# Patient Record
Sex: Female | Born: 1975 | Race: Black or African American | Hispanic: No | Marital: Married | Smoking: Never smoker
Health system: Southern US, Community
[De-identification: ages and names within clinical notes are randomized; demographics above are authoritative.]

## PROBLEM LIST (undated history)

## (undated) DIAGNOSIS — F32A Depression, unspecified: Secondary | ICD-10-CM

## (undated) DIAGNOSIS — D649 Anemia, unspecified: Secondary | ICD-10-CM

## (undated) DIAGNOSIS — G35 Multiple sclerosis: Secondary | ICD-10-CM

## (undated) DIAGNOSIS — M329 Systemic lupus erythematosus, unspecified: Secondary | ICD-10-CM

## (undated) DIAGNOSIS — IMO0002 Reserved for concepts with insufficient information to code with codable children: Secondary | ICD-10-CM

## (undated) DIAGNOSIS — F419 Anxiety disorder, unspecified: Secondary | ICD-10-CM

## (undated) DIAGNOSIS — T7840XA Allergy, unspecified, initial encounter: Secondary | ICD-10-CM

## (undated) DIAGNOSIS — K219 Gastro-esophageal reflux disease without esophagitis: Secondary | ICD-10-CM

## (undated) DIAGNOSIS — H269 Unspecified cataract: Secondary | ICD-10-CM

## (undated) DIAGNOSIS — I639 Cerebral infarction, unspecified: Secondary | ICD-10-CM

## (undated) HISTORY — DX: Depression, unspecified: F32.A

## (undated) HISTORY — PX: EYE SURGERY: SHX253

## (undated) HISTORY — DX: Anxiety disorder, unspecified: F41.9

## (undated) HISTORY — DX: Anemia, unspecified: D64.9

## (undated) HISTORY — PX: GASTRIC BYPASS: SHX52

## (undated) HISTORY — DX: Unspecified cataract: H26.9

## (undated) HISTORY — DX: Allergy, unspecified, initial encounter: T78.40XA

## (undated) HISTORY — DX: Gastro-esophageal reflux disease without esophagitis: K21.9

## (undated) HISTORY — DX: Cerebral infarction, unspecified: I63.9

## (undated) HISTORY — PX: TUBAL LIGATION: SHX77

## (undated) HISTORY — PX: ENDOMETRIAL ABLATION: SHX621

## (undated) HISTORY — PX: CATARACT EXTRACTION: SUR2

---

## 2018-10-19 LAB — HM HEPATITIS C SCREENING LAB: HM Hepatitis Screen: NEGATIVE

## 2018-10-19 LAB — HM HIV SCREENING LAB: HM HIV Screening: NEGATIVE

## 2020-07-11 LAB — LIPID PANEL
Cholesterol: 131 (ref 0–200)
HDL: 58 (ref 35–70)
LDL Cholesterol: 64
Triglycerides: 56 (ref 40–160)

## 2020-07-11 LAB — TSH: TSH: 3.18 (ref 0.41–5.90)

## 2020-09-12 ENCOUNTER — Emergency Department (HOSPITAL_BASED_OUTPATIENT_CLINIC_OR_DEPARTMENT_OTHER)
Admission: EM | Admit: 2020-09-12 | Discharge: 2020-09-12 | Disposition: A | Discharge status: Home / Self Care | Payer: 59 | Attending: Emergency Medicine | Admitting: Emergency Medicine

## 2020-09-12 ENCOUNTER — Encounter (HOSPITAL_BASED_OUTPATIENT_CLINIC_OR_DEPARTMENT_OTHER): Payer: Self-pay

## 2020-09-12 ENCOUNTER — Emergency Department (HOSPITAL_BASED_OUTPATIENT_CLINIC_OR_DEPARTMENT_OTHER): Payer: 59

## 2020-09-12 ENCOUNTER — Other Ambulatory Visit: Payer: Self-pay

## 2020-09-12 DIAGNOSIS — N898 Other specified noninflammatory disorders of vagina: Secondary | ICD-10-CM | POA: Diagnosis present

## 2020-09-12 DIAGNOSIS — B9689 Other specified bacterial agents as the cause of diseases classified elsewhere: Secondary | ICD-10-CM

## 2020-09-12 DIAGNOSIS — B373 Candidiasis of vulva and vagina: Secondary | ICD-10-CM | POA: Insufficient documentation

## 2020-09-12 DIAGNOSIS — R110 Nausea: Secondary | ICD-10-CM | POA: Insufficient documentation

## 2020-09-12 DIAGNOSIS — R1030 Lower abdominal pain, unspecified: Secondary | ICD-10-CM | POA: Diagnosis not present

## 2020-09-12 DIAGNOSIS — N760 Acute vaginitis: Secondary | ICD-10-CM | POA: Insufficient documentation

## 2020-09-12 DIAGNOSIS — B3731 Acute candidiasis of vulva and vagina: Secondary | ICD-10-CM

## 2020-09-12 HISTORY — DX: Multiple sclerosis: G35

## 2020-09-12 HISTORY — DX: Systemic lupus erythematosus, unspecified: M32.9

## 2020-09-12 HISTORY — DX: Reserved for concepts with insufficient information to code with codable children: IMO0002

## 2020-09-12 LAB — CBC WITH DIFFERENTIAL/PLATELET
Abs Immature Granulocytes: 0.03 10*3/uL (ref 0.00–0.07)
Basophils Absolute: 0 10*3/uL (ref 0.0–0.1)
Basophils Relative: 0 %
Eosinophils Absolute: 0 10*3/uL (ref 0.0–0.5)
Eosinophils Relative: 0 %
HCT: 32.9 % — ABNORMAL LOW (ref 36.0–46.0)
Hemoglobin: 11.2 g/dL — ABNORMAL LOW (ref 12.0–15.0)
Immature Granulocytes: 0 %
Lymphocytes Relative: 4 %
Lymphs Abs: 0.3 10*3/uL — ABNORMAL LOW (ref 0.7–4.0)
MCH: 31.2 pg (ref 26.0–34.0)
MCHC: 34 g/dL (ref 30.0–36.0)
MCV: 91.6 fL (ref 80.0–100.0)
Monocytes Absolute: 0.2 10*3/uL (ref 0.1–1.0)
Monocytes Relative: 2 %
Neutro Abs: 8.1 10*3/uL — ABNORMAL HIGH (ref 1.7–7.7)
Neutrophils Relative %: 94 %
Platelets: 277 10*3/uL (ref 150–400)
RBC: 3.59 MIL/uL — ABNORMAL LOW (ref 3.87–5.11)
RDW: 14.2 % (ref 11.5–15.5)
WBC: 8.6 10*3/uL (ref 4.0–10.5)
nRBC: 0 % (ref 0.0–0.2)

## 2020-09-12 LAB — URINALYSIS, ROUTINE W REFLEX MICROSCOPIC
Bilirubin Urine: NEGATIVE
Glucose, UA: NEGATIVE mg/dL
Hgb urine dipstick: NEGATIVE
Ketones, ur: 80 mg/dL — AB
Leukocytes,Ua: NEGATIVE
Nitrite: NEGATIVE
Protein, ur: NEGATIVE mg/dL
Specific Gravity, Urine: 1.025 (ref 1.005–1.030)
pH: 6.5 (ref 5.0–8.0)

## 2020-09-12 LAB — MAGNESIUM: Magnesium: 1.5 mg/dL — ABNORMAL LOW (ref 1.7–2.4)

## 2020-09-12 LAB — WET PREP, GENITAL
Sperm: NONE SEEN
Trich, Wet Prep: NONE SEEN

## 2020-09-12 LAB — LIPASE, BLOOD: Lipase: 19 U/L (ref 11–51)

## 2020-09-12 LAB — COMPREHENSIVE METABOLIC PANEL
ALT: 12 U/L (ref 0–44)
AST: 19 U/L (ref 15–41)
Albumin: 3.2 g/dL — ABNORMAL LOW (ref 3.5–5.0)
Alkaline Phosphatase: 62 U/L (ref 38–126)
Anion gap: 9 (ref 5–15)
BUN: 7 mg/dL (ref 6–20)
CO2: 20 mmol/L — ABNORMAL LOW (ref 22–32)
Calcium: 8.1 mg/dL — ABNORMAL LOW (ref 8.9–10.3)
Chloride: 103 mmol/L (ref 98–111)
Creatinine, Ser: 0.5 mg/dL (ref 0.44–1.00)
GFR, Estimated: >60 mL/min (ref >60)
Glucose, Bld: 95 mg/dL (ref 70–99)
Potassium: 3.6 mmol/L (ref 3.5–5.1)
Sodium: 132 mmol/L — ABNORMAL LOW (ref 135–145)
Total Bilirubin: 0.3 mg/dL (ref 0.3–1.2)
Total Protein: 5.8 g/dL — ABNORMAL LOW (ref 6.5–8.1)

## 2020-09-12 LAB — PREGNANCY, URINE: Preg Test, Ur: NEGATIVE

## 2020-09-12 MED ORDER — FLUCONAZOLE 150 MG PO TABS
150.0000 mg | ORAL_TABLET | Freq: Once | ORAL | Status: AC
  Administered 2020-09-12: 150 mg via ORAL
  Filled 2020-09-12: qty 1

## 2020-09-12 MED ORDER — OXYCODONE HCL 5 MG PO TABS
10.0000 mg | ORAL_TABLET | Freq: Once | ORAL | Status: AC
  Administered 2020-09-12: 10 mg via ORAL
  Filled 2020-09-12: qty 2

## 2020-09-12 MED ORDER — METRONIDAZOLE 500 MG PO TABS: 500.0000 mg | ORAL_TABLET | Freq: Two times a day (BID) | ORAL | 0 refills | Status: AC

## 2020-09-12 MED ORDER — MAGNESIUM SULFATE 2 GM/50ML IV SOLN
2.0000 g | Freq: Once | INTRAVENOUS | Status: AC
  Administered 2020-09-12: 2 g via INTRAVENOUS
  Filled 2020-09-12: qty 50

## 2020-09-12 MED ORDER — IOHEXOL 350 MG/ML SOLN
100.0000 mL | Freq: Once | INTRAVENOUS | Status: AC | PRN
  Administered 2020-09-12: 85 mL via INTRAVENOUS

## 2020-09-12 MED ORDER — LACTATED RINGERS IV BOLUS
1000.0000 mL | Freq: Once | INTRAVENOUS | Status: AC
  Administered 2020-09-12: 1000 mL via INTRAVENOUS

## 2020-09-12 MED ORDER — METOCLOPRAMIDE HCL 5 MG/ML IJ SOLN
10.0000 mg | Freq: Once | INTRAMUSCULAR | Status: AC
  Administered 2020-09-12: 10 mg via INTRAVENOUS
  Filled 2020-09-12: qty 2

## 2020-09-12 NOTE — ED Triage Notes (Signed)
Pt c/o lower abd pain, nausea, increase urination, vaginal dx-sx started 4 days ago-NAD-to triage in w/c

## 2020-09-12 NOTE — ED Provider Notes (Signed)
[facility] EMERGENCY DEPARTMENT Provider Note   CSN: [id number] Arrival date & time: 09/12/20  1249     History Chief Complaint  Patient presents with   Abdominal Pain    Sabrina Owen is a 45 y.o. female.   Abdominal Pain Associated symptoms: nausea and vaginal discharge   Associated symptoms: no chest pain, no chills, no constipation, no cough, no diarrhea, no dysuria, no fatigue, no fever, no hematuria, no shortness of breath, no sore throat, no vaginal bleeding and no vomiting   Patient presents for lower abdominal pain. She has a history of MS.  She is followed by neurology at [facility].  She started getting Rituxan IV infusions every 5 months.  Her initial infusion was last month.  She states that since this infusion, she has had flareup of genital herpes and has generally felt unwell.  She has had ongoing decreased appetite and intermittent nausea.  She states that she has chronic bladder symptoms and has been having urinary urgency and frequency.  She denies any dysuria.  Over the past 4 days, she has developed a lower abdominal pain.  Pain is described as crampy.  It is reminiscent of her menstrual cycle pains, however, patient no longer has menstrual periods due to a previous uterine ablation.  She has been told that she has uterine fibroids.  She treats her pain with Tylenol and she did take a dose of oxycodone earlier today which did help her symptoms.  She states that she avoids NSAIDs due to history of gastric bypass surgery.  Patient has been able to eat, despite decreased appetite.  She has been able to take in fluids.  She has not had vomiting.  She has not had fevers or chills.  She does describe a white vaginal discharge.    Past Medical History:  Diagnosis Date   Lupus (HCC)    MS (multiple sclerosis) (HCC)     There are no problems to display for this patient.   Past Surgical History:  Procedure Laterality Date   CATARACT EXTRACTION     GASTRIC  BYPASS     TUBAL LIGATION       OB History   No obstetric history on file.     No family history on file.  Social History   Tobacco Use   Smoking status: Never   Smokeless tobacco: Never  Substance Use Topics   Alcohol use: Yes    Comment: occ   Drug use: Never    Home Medications Prior to Admission medications   Medication Sig Start Date End Date Taking? Authorizing Provider  FLUoxetine (PROZAC) 20 MG capsule Take 3 capsules by mouth daily. 01/13/13  Yes [provider]  metroNIDAZOLE (FLAGYL) 500 MG tablet Take 1 tablet (500 mg total) by mouth 2 (two) times daily for 7 days. 09/12/20 09/19/20 Yes Gloris Manchester, MD  valACYclovir (VALTREX) 500 MG tablet Take 1 tablet by mouth daily. 08/29/12  Yes [provider]  Multiple Vitamin (MULTI-VITAMIN) tablet Take 1 tablet by mouth daily.    [provider]    Allergies    Topamax [topiramate]  Review of Systems   Review of Systems  Constitutional:  Positive for appetite change. Negative for activity change, chills, diaphoresis, fatigue and fever.  HENT:  Negative for ear pain and sore throat.   Eyes:  Negative for pain and visual disturbance.  Respiratory:  Negative for cough, chest tightness and shortness of breath.   Cardiovascular:  Negative for chest  pain and palpitations.  Gastrointestinal:  Positive for abdominal pain and nausea. Negative for abdominal distention, blood in stool, constipation, diarrhea and vomiting.  Genitourinary:  Positive for frequency, pelvic pain, urgency and vaginal discharge. Negative for dysuria, flank pain, hematuria and vaginal bleeding.  Musculoskeletal:  Negative for arthralgias, back pain, joint swelling and myalgias.  Skin:  Negative for color change and rash.  Neurological:  Negative for dizziness, seizures, syncope, weakness, light-headedness, numbness and headaches.  All other systems reviewed and are negative.  Physical Exam Updated Vital Signs BP 119/84 (BP  Location: Right Arm)   Pulse 81   Temp 97.9 F (36.6 C) (Oral)   Resp 18   Ht 5\' 8"  (1.727 m)   Wt 82.1 kg   SpO2 99%   BMI 27.52 kg/m   Physical Exam Vitals and nursing note reviewed.  Constitutional:      General: She is not in acute distress.    Appearance: She is well-developed. She is not ill-appearing, toxic-appearing or diaphoretic.  HENT:     Head: Normocephalic and atraumatic.     Mouth/Throat:     Mouth: Mucous membranes are moist.     Pharynx: Oropharynx is clear.  Eyes:     Conjunctiva/sclera: Conjunctivae normal.  Cardiovascular:     Rate and Rhythm: Normal rate and regular rhythm.     Heart sounds: No murmur heard. Pulmonary:     Effort: Pulmonary effort is normal. No respiratory distress.     Breath sounds: Normal breath sounds.  Abdominal:     Palpations: Abdomen is soft.     Tenderness: There is abdominal tenderness in the suprapubic area. There is no right CVA tenderness, left CVA tenderness, guarding or rebound.  Musculoskeletal:     Cervical back: Neck supple.  Skin:    General: Skin is warm and dry.  Neurological:     General: No focal deficit present.     Mental Status: She is alert and oriented to person, place, and time.     Cranial Nerves: No cranial nerve deficit.     Motor: No weakness.  Psychiatric:        Mood and Affect: Mood normal. Affect is flat.        Speech: Speech normal.        Behavior: Behavior normal. Behavior is cooperative.        Thought Content: Thought content normal.    ED Results / Procedures / Treatments   Labs (all labs ordered are listed, but only abnormal results are displayed) Labs Reviewed  WET PREP, GENITAL - Abnormal; Notable for the following components:      Result Value   Yeast Wet Prep HPF POC MODERATE (*)    Clue Cells Wet Prep HPF POC PRESENT (*)    WBC, Wet Prep HPF POC FEW (*)    All other components within normal limits  CBC WITH DIFFERENTIAL/PLATELET - Abnormal; Notable for the following  components:   RBC 3.59 (*)    Hemoglobin 11.2 (*)    HCT 32.9 (*)    Neutro Abs 8.1 (*)    Lymphs Abs 0.3 (*)    All other components within normal limits  COMPREHENSIVE METABOLIC PANEL - Abnormal; Notable for the following components:   Sodium 132 (*)    CO2 20 (*)    Calcium 8.1 (*)    Total Protein 5.8 (*)    Albumin 3.2 (*)    All other components within normal limits  MAGNESIUM - Abnormal; Notable  for the following components:   Magnesium 1.5 (*)    All other components within normal limits  URINALYSIS, ROUTINE W REFLEX MICROSCOPIC - Abnormal; Notable for the following components:   Ketones, ur 80 (*)    All other components within normal limits  LIPASE, BLOOD  PREGNANCY, URINE  GC/CHLAMYDIA PROBE AMP () NOT AT [facility]    EKG None  Radiology CT ABDOMEN PELVIS W CONTRAST  Result Date: 09/12/2020 CLINICAL DATA:  Lower abdominal pain, nausea, increased urination EXAM: CT ABDOMEN AND PELVIS WITH CONTRAST TECHNIQUE: Multidetector CT imaging of the abdomen and pelvis was performed using the standard protocol following bolus administration of intravenous contrast. CONTRAST:  60mL OMNIPAQUE IOHEXOL 350 MG/ML SOLN COMPARISON:  CT abdomen/pelvis 06/03/2019 FINDINGS: Lower chest: The lung bases are clear. The imaged heart is unremarkable. Hepatobiliary: Hypodensity along the falciform ligament is unchanged, likely reflecting fatty infiltration. A subcentimeter hypodense lesion in the left hepatic lobe is too small to characterize but likely reflects a cyst. There are no suspicious lesions. The gallbladder is unremarkable. There is no biliary ductal dilatation. Pancreas: Unremarkable. Spleen: Unremarkable. Adrenals/Urinary Tract: The adrenals are unremarkable. There is an 11 mm nonobstructing left lower pole renal stone, increased in size since the prior study. No other focal lesions are stones are identified. There is no hydronephrosis or hydroureter. The bladder is decompressed but  grossly unremarkable. Stomach/Bowel: The patient is status post Roux-en-Y gastric bypass. There is no evidence of complication at the anastomotic sites. There is no evidence of bowel obstruction. There is no abnormal bowel wall thickening or inflammatory change. Vascular/Lymphatic: The abdominal aorta is nonaneurysmal. The major branch vessels are patent. The main portal and splenic veins are patent. 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 Reproductive: There is a 2.7 cm intramural fibroid in the posterior aspect of the uterus, unchanged. There is a 3.0 cm right adnexal cyst. There is no left adnexal mass. Other: There is no ascites or free air. Musculoskeletal: There is no acute osseous abnormality or aggressive osseous lesion. IMPRESSION: 1. No acute findings in the abdomen or pelvis. 2. 1.1 cm nonobstructing left lower pole renal stone has increased in size since the prior study from 2021. 3. Fibroid uterus. Electronically Signed   By: Lesia Hausen M.D.   On: 09/12/2020 15:18    Procedures Procedures   Medications Ordered in ED Medications  lactated ringers bolus 1,000 mL (0 mLs Intravenous Stopped 09/12/20 1545)  metoCLOPramide (REGLAN) injection 10 mg (10 mg Intravenous Given 09/12/20 1400)  iohexol (OMNIPAQUE) 350 MG/ML injection 100 mL (85 mLs Intravenous Contrast Given 09/12/20 1437)  magnesium sulfate IVPB 2 g 50 mL (0 g Intravenous Stopped 09/12/20 1649)  oxyCODONE (Oxy IR/ROXICODONE) immediate release tablet 10 mg (10 mg Oral Given 09/12/20 1602)  fluconazole (DIFLUCAN) tablet 150 mg (150 mg Oral Given 09/12/20 1649)    ED Course  I have reviewed the triage vital signs and the nursing notes.  Pertinent labs & imaging results that were available during my care of the patient were reviewed by me and considered in my medical decision making (see chart for details).    MDM Rules/Calculators/A&P                         Patient is a 45 year old female with history of gastric bypass  surgery, MS, and uterine ablation, presenting for crampy lower abdominal pain over the past 4 days.  She also describes a scant white vaginal  discharge.  Vital signs normal upon arrival.  Patient is overall well-appearing.  Exam is notable for lower abdominal tenderness that is not greater on one side or the other.  Labs, including urine studies were ordered.  Given her history of bypass surgery, CT scan of abdomen pelvis was ordered.  Patient was given IV fluids.  Reglan was ordered for symptomatic relief.  Urinalysis showed no evidence of acute infection.  Patient did have ketonuria, consistent with decreased p.o. intake.  Labs notable for hypomagnesemia, which was replaced in the ED.  Patient does not have a leukocytosis.  CT scan showed no acute findings.  Patient does have a fibroid uterus.  On reassessment, patient endorsed continued lower abdominal pain.  Roxicodone was given for analgesia.  Pelvic exam was performed with chaperone present.  Findings notable for thick white discharge.  While in the ED, patient had resolution of her abdominal pain and was able to eat and drink.  Wet prep showed vaginal candidiasis as well as bacterial vaginosis.  Patient was ordered single dose of Diflucan and provided prescription for Flagyl.  She was discharged in good condition.  Final Clinical Impression(s) / ED Diagnoses Final diagnoses:  Vaginal candidiasis  BV (bacterial vaginosis)    Rx / DC Orders ED Discharge Orders          Ordered    metroNIDAZOLE (FLAGYL) 500 MG tablet  2 times daily        09/12/20 1641             Gloris Manchester, MD 09/13/20 1720

## 2020-09-12 NOTE — ED Notes (Signed)
IV access unsuccessful x 2 attempts. 2nd RN to attempt.

## 2020-09-12 NOTE — ED Notes (Signed)
Delice Bison, RN placed successful IV, no blood obtained. Fluids started and med given, will attempt bloodwork again

## 2020-09-12 NOTE — ED Notes (Signed)
Patient transported to CT 

## 2020-09-12 NOTE — ED Notes (Signed)
ED Provider at bedside. Dixon MD. 

## 2020-09-13 LAB — GC/CHLAMYDIA PROBE AMP (~~LOC~~) NOT AT ARMC
Chlamydia: NEGATIVE
Comment: NEGATIVE
Comment: NORMAL
Neisseria Gonorrhea: NEGATIVE

## 2020-11-21 ENCOUNTER — Ambulatory Visit: Payer: 59 | Admitting: Internal Medicine

## 2020-11-21 ENCOUNTER — Encounter: Payer: Self-pay | Admitting: Internal Medicine

## 2020-11-21 ENCOUNTER — Other Ambulatory Visit: Payer: Self-pay

## 2020-11-21 VITALS — BP 128/84 | HR 64 | Temp 98.1°F | Resp 16 | Ht 68.0 in | Wt 180.0 lb

## 2020-11-21 DIAGNOSIS — Z9884 Bariatric surgery status: Secondary | ICD-10-CM | POA: Diagnosis not present

## 2020-11-21 DIAGNOSIS — G35 Multiple sclerosis: Secondary | ICD-10-CM | POA: Diagnosis not present

## 2020-11-21 DIAGNOSIS — D539 Nutritional anemia, unspecified: Secondary | ICD-10-CM | POA: Diagnosis not present

## 2020-11-21 DIAGNOSIS — D538 Other specified nutritional anemias: Secondary | ICD-10-CM

## 2020-11-21 DIAGNOSIS — E519 Thiamine deficiency, unspecified: Secondary | ICD-10-CM

## 2020-11-21 LAB — CBC WITH DIFFERENTIAL/PLATELET
Basophils Absolute: 0 10*3/uL (ref 0.0–0.1)
Basophils Relative: 0.6 % (ref 0.0–3.0)
Eosinophils Absolute: 0.1 10*3/uL (ref 0.0–0.7)
Eosinophils Relative: 1.2 % (ref 0.0–5.0)
HCT: 36.8 % (ref 36.0–46.0)
Hemoglobin: 12 g/dL (ref 12.0–15.0)
Lymphocytes Relative: 20.4 % (ref 12.0–46.0)
Lymphs Abs: 1.3 10*3/uL (ref 0.7–4.0)
MCHC: 32.7 g/dL (ref 30.0–36.0)
MCV: 91.3 fl (ref 78.0–100.0)
Monocytes Absolute: 0.6 10*3/uL (ref 0.1–1.0)
Monocytes Relative: 9.1 % (ref 3.0–12.0)
Neutro Abs: 4.3 10*3/uL (ref 1.4–7.7)
Neutrophils Relative %: 68.7 % (ref 43.0–77.0)
Platelets: 312 10*3/uL (ref 150.0–400.0)
RBC: 4.03 Mil/uL (ref 3.87–5.11)
RDW: 15 % (ref 11.5–15.5)
WBC: 6.3 10*3/uL (ref 4.0–10.5)

## 2020-11-21 LAB — BASIC METABOLIC PANEL
BUN: 10 mg/dL (ref 6–23)
CO2: 30 mEq/L (ref 19–32)
Calcium: 8.5 mg/dL (ref 8.4–10.5)
Chloride: 103 mEq/L (ref 96–112)
Creatinine, Ser: 0.71 mg/dL (ref 0.40–1.20)
GFR: 103.59 mL/min (ref >60.00)
Glucose, Bld: 82 mg/dL (ref 70–99)
Potassium: 3.7 mEq/L (ref 3.5–5.1)
Sodium: 139 mEq/L (ref 135–145)

## 2020-11-21 LAB — IBC + FERRITIN
Ferritin: 13.2 ng/mL (ref 10.0–291.0)
Iron: 47 ug/dL (ref 42–145)
Saturation Ratios: 13.1 % — ABNORMAL LOW (ref 20.0–50.0)
TIBC: 359.8 ug/dL (ref 250.0–450.0)
Transferrin: 257 mg/dL (ref 212.0–360.0)

## 2020-11-21 LAB — VITAMIN B12: Vitamin B-12: 232 pg/mL (ref 211–911)

## 2020-11-21 LAB — FOLATE: Folate: 7.2 ng/mL (ref >5.9)

## 2020-11-21 NOTE — Patient Instructions (Signed)
Anemia Anemia is a condition in which there is not enough red blood cells or hemoglobin in the blood. Hemoglobin is a substance in red blood cells that carries oxygen. When you do not have enough red blood cells or hemoglobin (are anemic), your body cannot get enough oxygen and your organs may not work properly. As a result, you may feel very tired or have other problems. What are the causes? Common causes of anemia include: Excessive bleeding. Anemia can be caused by excessive bleeding inside or outside the body, including bleeding from the intestines or from heavy menstrual periods in females. Poor nutrition. Long-lasting (chronic) kidney, thyroid, and liver disease. Bone marrow disorders, spleen problems, and blood disorders. Cancer and treatments for cancer. HIV (human immunodeficiency virus) and AIDS (acquired immunodeficiency syndrome). Infections, medicines, and autoimmune disorders that destroy red blood cells. What are the signs or symptoms? Symptoms of this condition include: Minor weakness. Dizziness. Headache, or difficulties concentrating and sleeping. Heartbeats that feel irregular or faster than normal (palpitations). Shortness of breath, especially with exercise. Pale skin, lips, and nails, or cold hands and feet. Indigestion and nausea. Symptoms may occur suddenly or develop slowly. If your anemia is mild, you may not have symptoms. How is this diagnosed? This condition is diagnosed based on blood tests, your medical history, and a physical exam. In some cases, a test may be needed in which cells are removed from the soft tissue inside of a bone and looked at under a microscope (bone marrow biopsy). Your health care provider may also check your stool (feces) for blood and may do additional testing to look for the cause of your bleeding. Other tests may include: Imaging tests, such as a CT scan or MRI. A procedure to see inside your esophagus and stomach (endoscopy). A  procedure to see inside your colon and rectum (colonoscopy). How is this treated? Treatment for this condition depends on the cause. If you continue to lose a lot of blood, you may need to be treated at a hospital. Treatment may include: Taking supplements of iron, vitamin B12, or folic acid. Taking a hormone medicine (erythropoietin) that can help to stimulate red blood cell growth. Having a blood transfusion. This may be needed if you lose a lot of blood. Making changes to your diet. Having surgery to remove your spleen. Follow these instructions at home: Take over-the-counter and prescription medicines only as told by your health care provider. Take supplements only as told by your health care provider. Follow any diet instructions that you were given by your health care provider. Keep all follow-up visits as told by your health care provider. This is important. Contact a health care provider if: You develop new bleeding anywhere in the body. Get help right away if: You are very weak. You are short of breath. You have pain in your abdomen or chest. You are dizzy or feel faint. You have trouble concentrating. You have bloody stools, black stools, or tarry stools. You vomit repeatedly or you vomit up blood. These symptoms may represent a serious problem that is an emergency. Do not wait to see if the symptoms will go away. Get medical help right away. Call your local emergency services (911 in the [place] Do not drive yourself to the hospital. Summary Anemia is a condition in which you do not have enough red blood cells or enough of a substance in your red blood cells that carries oxygen (hemoglobin). Symptoms may occur suddenly or develop slowly. If your anemia is   mild, you may not have symptoms. This condition is diagnosed with blood tests, a medical history, and a physical exam. Other tests may be needed. Treatment for this condition depends on the cause of the anemia. This  information is not intended to replace advice given to you by your health care provider. Make sure you discuss any questions you have with your health care provider. Document Revised: 11/30/2018 Document Reviewed: 11/30/2018 Elsevier Patient Education  2022 [employer]vier Inc.  

## 2020-11-21 NOTE — Progress Notes (Signed)
Subjective:  Patient ID: Sabrina Owen, female    DOB: [birth year]  Age: 45 y.o. MRN: [id number]  CC: Anemia  This visit occurred during the SARS-CoV-2 public health emergency.  Safety protocols were in place, including screening questions prior to the visit, additional usage of staff PPE, and extensive cleaning of exam room while observing appropriate contact time as indicated for disinfecting solutions.    HPI Tamula Morrical presents for f/up and to establish.  She is s/p gastric sleeve and bypass. Recent labs revealed anemia. She is taking an MVI. She does not have cycles s/p ablation.  History Kiosha has a past medical history of Lupus (HCC) and MS (multiple sclerosis) (HCC).   She has a past surgical history that includes Gastric bypass; Cataract extraction; and Tubal ligation.   Her family history includes Depression in her mother; Hypertension in her mother.She reports that she has never smoked. She has never used smokeless tobacco. She reports current alcohol use of about 2.0 standard drinks per week. She reports that she does not use drugs.  Outpatient Medications Prior to Visit  Medication Sig Dispense Refill   FLUoxetine (PROZAC) 20 MG capsule Take 3 capsules by mouth daily.     Multiple Vitamin (MULTI-VITAMIN) tablet Take 1 tablet by mouth daily.     valACYclovir (VALTREX) 500 MG tablet Take 1 tablet by mouth daily.     No facility-administered medications prior to visit.    ROS Review of Systems  Objective:  BP 128/84 (BP Location: Left Arm, Patient Position: Sitting, Cuff Size: Large)   Pulse 64   Temp 98.1 F (36.7 C) (Oral)   Resp 16   Ht 5\' 8"  (1.727 m)   Wt 180 lb (81.6 kg)   SpO2 97%   BMI 27.37 kg/m   Physical Exam  Lab Results  Component Value Date   WBC 6.3 11/21/2020   HGB 12.0 11/21/2020   HCT 36.8 11/21/2020   PLT 312.0 11/21/2020   GLUCOSE 82 11/21/2020   CHOL 131 07/11/2020   TRIG 56 07/11/2020   HDL 58 07/11/2020   LDLCALC 64 07/11/2020    ALT 12 09/12/2020   AST 19 09/12/2020   NA 139 11/21/2020   K 3.7 11/21/2020   CL 103 11/21/2020   CREATININE 0.71 11/21/2020   BUN 10 11/21/2020   CO2 30 11/21/2020   TSH 3.18 07/11/2020     Assessment & Plan:   Shelsie was seen today for anemia.  Diagnoses and all orders for this visit:  Deficiency anemia- Her H/H have improved but her B1 and zinc are low. -     Vitamin B12; Future -     IBC + Ferritin; Future -     CBC with Differential/Platelet; Future -     Vitamin B1; Future -     Zinc; Future -     Folate; Future -     Folate -     Zinc -     Vitamin B1 -     CBC with Differential/Platelet -     IBC + Ferritin -     Vitamin B12  Gastric bypass status for obesity -     Vitamin B12; Future -     IBC + Ferritin; Future -     CBC with Differential/Platelet; Future -     Vitamin B1; Future -     Zinc; Future -     Folate; Future -     Folate -     Zinc -  Vitamin B1 -     CBC with Differential/Platelet -     IBC + Ferritin -     Vitamin B12  MS (multiple sclerosis) (HCC) -     Basic metabolic panel; Future -     Basic metabolic panel  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  Anemia due to acquired thiamine deficiency -     thiamine (VITAMIN B-1) 50 MG tablet; Take 1 tablet (50 mg total) by mouth daily.  I am having Alira Fretwell start on thiamine and zinc gluconate. I am also having her maintain her FLUoxetine, Multi-Vitamin, and valACYclovir.  Meds ordered this encounter  Medications   thiamine (VITAMIN B-1) 50 MG tablet    Sig: Take 1 tablet (50 mg total) by mouth daily.    Dispense:  90 tablet    Refill:  1   zinc gluconate 50 MG tablet    Sig: Take 1 tablet (50 mg total) by mouth daily.    Dispense:  90 tablet    Refill:  1      Follow-up: Return in about 3 months (around 02/21/2021).  Sanda Linger, MD

## 2020-11-23 ENCOUNTER — Encounter: Payer: Self-pay | Admitting: Internal Medicine

## 2020-11-24 ENCOUNTER — Encounter: Payer: Self-pay | Admitting: Internal Medicine

## 2020-11-24 DIAGNOSIS — E519 Thiamine deficiency, unspecified: Secondary | ICD-10-CM | POA: Insufficient documentation

## 2020-11-24 DIAGNOSIS — D538 Other specified nutritional anemias: Secondary | ICD-10-CM | POA: Insufficient documentation

## 2020-11-24 LAB — VITAMIN B1: Vitamin B1 (Thiamine): 8 nmol/L (ref 8–30)

## 2020-11-24 LAB — ZINC: Zinc: 42 ug/dL — ABNORMAL LOW (ref 60–130)

## 2020-11-24 MED ORDER — ZINC GLUCONATE 50 MG PO TABS: 50.0000 mg | ORAL_TABLET | Freq: Every day | ORAL | 1 refills | Status: DC

## 2020-11-24 MED ORDER — VITAMIN B-1 50 MG PO TABS: 50.0000 mg | ORAL_TABLET | Freq: Every day | ORAL | 1 refills | Status: DC

## 2020-12-19 ENCOUNTER — Encounter: Payer: Self-pay | Admitting: Internal Medicine

## 2021-01-08 ENCOUNTER — Telehealth: Payer: 59 | Admitting: Physician Assistant

## 2021-01-08 DIAGNOSIS — J069 Acute upper respiratory infection, unspecified: Secondary | ICD-10-CM

## 2021-01-08 MED ORDER — BENZONATATE 100 MG PO CAPS: 100.0000 mg | ORAL_CAPSULE | Freq: Three times a day (TID) | ORAL | 0 refills | Status: AC

## 2021-01-08 MED ORDER — FLUTICASONE PROPIONATE 50 MCG/ACT NA SUSP: 2.0000 | Freq: Every day | NASAL | 0 refills | Status: DC

## 2021-01-08 NOTE — Progress Notes (Signed)

## 2021-01-11 ENCOUNTER — Ambulatory Visit: Payer: 59 | Admitting: Nurse Practitioner

## 2021-01-11 ENCOUNTER — Other Ambulatory Visit: Payer: Self-pay

## 2021-01-11 VITALS — BP 132/86 | HR 69 | Temp 98.2°F | Ht 68.0 in | Wt 171.0 lb

## 2021-01-11 DIAGNOSIS — J029 Acute pharyngitis, unspecified: Secondary | ICD-10-CM | POA: Diagnosis not present

## 2021-01-11 DIAGNOSIS — R051 Acute cough: Secondary | ICD-10-CM | POA: Diagnosis not present

## 2021-01-11 LAB — POCT RAPID STREP A (OFFICE)
Rapid Strep A Screen: NEGATIVE
Rapid Strep A Screen: NEGATIVE

## 2021-01-11 MED ORDER — GUAIFENESIN-CODEINE 100-10 MG/5ML PO SOLN: 5.0000 mL | Freq: Two times a day (BID) | ORAL | 0 refills | Status: DC | PRN

## 2021-01-11 MED ORDER — DOXYCYCLINE HYCLATE 100 MG PO TABS
100.0000 mg | ORAL_TABLET | Freq: Two times a day (BID) | ORAL | 0 refills | Status: DC
Start: 2021-01-11 — End: 2021-12-10

## 2021-01-11 NOTE — Progress Notes (Signed)
Subjective:  Patient ID: Sabrina Owen, female    DOB: February 02, 1975  Age: 46 y.o. MRN: [id number]  CC:  Chief Complaint  Patient presents with   Cough    Started 2 weeks ago but is getting worse. Chest pains with cough more like a dry cough. Sometimes coughs up yellow/green mucus. Voice is going in and out.      HPI  This patient arrives today for the above.  Symptoms have been present for approximately 2 weeks.  Her main concern is productive cough and sore throat.  She is also experiencing some mild shortness of breath.  She tells me she is tested for COVID and she was negative.  She would like to be tested for strep throat today.  She has been taking Tessalon Perles for cough suppression but tells me her symptoms are not improved with this medication.  Past Medical History:  Diagnosis Date   Lupus (HCC)    MS (multiple sclerosis) (HCC)       Family History  Problem Relation Age of Onset   Hypertension Mother    Depression Mother     Social History   Social History Narrative   Not on file   Social History   Tobacco Use   Smoking status: Never   Smokeless tobacco: Never  Substance Use Topics   Alcohol use: Yes    Alcohol/week: 2.0 standard drinks    Types: 2 Glasses of wine per week    Comment: occ     Current Meds  Medication Sig   benzonatate (TESSALON) 100 MG capsule Take 1 capsule (100 mg total) by mouth every 8 (eight) hours for 5 days.   FLUoxetine (PROZAC) 20 MG capsule Take 3 capsules by mouth daily.   fluticasone (FLONASE) 50 MCG/ACT nasal spray Place 2 sprays into both nostrils daily.   Multiple Vitamin (MULTI-VITAMIN) tablet Take 1 tablet by mouth daily.   valACYclovir (VALTREX) 500 MG tablet Take 1 tablet by mouth daily.   zinc gluconate 50 MG tablet Take 1 tablet (50 mg total) by mouth daily.    ROS:  Review of Systems  Constitutional:  Positive for malaise/fatigue. Negative for chills and fever.  HENT:  Positive for congestion  (intermittently).   Respiratory:  Positive for cough, sputum production (yellowish), shortness of breath and wheezing.   Cardiovascular:  Positive for chest pain (with coughing).  Gastrointestinal:  Negative for abdominal pain and diarrhea.  Musculoskeletal:  Negative for myalgias.  Neurological:  Positive for headaches.    Objective:   Today's Vitals: BP 132/86 (BP Location: Left Arm, Patient Position: Sitting, Cuff Size: Normal)    Pulse 69    Temp 98.2 F (36.8 C) (Oral)    Ht 5\' 8"  (1.727 m)    Wt 171 lb (77.6 kg)    SpO2 99%    BMI 26.00 kg/m  Vitals with BMI 01/11/2021 11/21/2020 09/12/2020  Height 5\' 8"  5\' 8"  -  Weight 171 lbs 180 lbs -  BMI 26.01 27.38 -  Systolic 132 128 11/12/2020  Diastolic 86 84 84  Pulse 69 64 81     Physical Exam Vitals reviewed.  Constitutional:      General: She is not in acute distress.    Appearance: Normal appearance.  HENT:     Head: Normocephalic and atraumatic.     Mouth/Throat:     Pharynx: Oropharynx is clear. Posterior oropharyngeal erythema present. No oropharyngeal exudate.  Neck:     Vascular: No  carotid bruit.  Cardiovascular:     Rate and Rhythm: Normal rate and regular rhythm.     Pulses: Normal pulses.     Heart sounds: Normal heart sounds.  Pulmonary:     Effort: Pulmonary effort is normal.     Breath sounds: Normal breath sounds. No wheezing.  Skin:    General: Skin is warm and dry.  Neurological:     General: No focal deficit present.     Mental Status: She is alert and oriented to person, place, and time.  Psychiatric:        Mood and Affect: Mood normal.        Behavior: Behavior normal.        Judgment: Judgment normal.    POC Strep: negative     Assessment and Plan   1. Acute cough   2. Sore throat      Plan: 1.,  2.  Strep test negative.  We will treat patient with antibiotics.  Will prescribe cough medicine with codeine as Tessalon Perles or not helping with cough suppression.  Patient was cautioned not to  drive while taking the medication.  She reports her understanding.  She was told to call the office if symptoms progress or do not improve despite taking the antibiotics, she expresses understanding.   Tests ordered Orders Placed This Encounter  Procedures   POCT rapid strep A      No orders of the defined types were placed in this encounter.   Patient to follow-up in 1 to 3 months with primary care provider or sooner as needed.  Elenore Paddy, NP

## 2021-06-19 ENCOUNTER — Other Ambulatory Visit: Payer: Self-pay

## 2021-06-19 ENCOUNTER — Emergency Department (HOSPITAL_BASED_OUTPATIENT_CLINIC_OR_DEPARTMENT_OTHER): Payer: 59

## 2021-06-19 ENCOUNTER — Emergency Department (HOSPITAL_BASED_OUTPATIENT_CLINIC_OR_DEPARTMENT_OTHER)
Admission: EM | Admit: 2021-06-19 | Discharge: 2021-06-19 | Disposition: A | Discharge status: Home / Self Care | Payer: 59 | Attending: Emergency Medicine | Admitting: Emergency Medicine

## 2021-06-19 ENCOUNTER — Other Ambulatory Visit (HOSPITAL_BASED_OUTPATIENT_CLINIC_OR_DEPARTMENT_OTHER): Payer: Self-pay

## 2021-06-19 ENCOUNTER — Encounter (HOSPITAL_BASED_OUTPATIENT_CLINIC_OR_DEPARTMENT_OTHER): Payer: Self-pay | Admitting: Pediatrics

## 2021-06-19 DIAGNOSIS — N200 Calculus of kidney: Secondary | ICD-10-CM

## 2021-06-19 DIAGNOSIS — N132 Hydronephrosis with renal and ureteral calculous obstruction: Secondary | ICD-10-CM | POA: Diagnosis not present

## 2021-06-19 DIAGNOSIS — R109 Unspecified abdominal pain: Secondary | ICD-10-CM | POA: Diagnosis present

## 2021-06-19 LAB — BASIC METABOLIC PANEL
Anion gap: 6 (ref 5–15)
BUN: 12 mg/dL (ref 6–20)
CO2: 25 mmol/L (ref 22–32)
Calcium: 8.9 mg/dL (ref 8.9–10.3)
Chloride: 106 mmol/L (ref 98–111)
Creatinine, Ser: 1.15 mg/dL — ABNORMAL HIGH (ref 0.44–1.00)
GFR, Estimated: >60 mL/min (ref >60)
Glucose, Bld: 91 mg/dL (ref 70–99)
Potassium: 4.3 mmol/L (ref 3.5–5.1)
Sodium: 137 mmol/L (ref 135–145)

## 2021-06-19 LAB — CBC
HCT: 36.1 % (ref 36.0–46.0)
Hemoglobin: 11.9 g/dL — ABNORMAL LOW (ref 12.0–15.0)
MCH: 29.8 pg (ref 26.0–34.0)
MCHC: 33 g/dL (ref 30.0–36.0)
MCV: 90.3 fL (ref 80.0–100.0)
Platelets: 316 10*3/uL (ref 150–400)
RBC: 4 MIL/uL (ref 3.87–5.11)
RDW: 14.3 % (ref 11.5–15.5)
WBC: 8 10*3/uL (ref 4.0–10.5)
nRBC: 0 % (ref 0.0–0.2)

## 2021-06-19 LAB — URINALYSIS, ROUTINE W REFLEX MICROSCOPIC
Bilirubin Urine: NEGATIVE
Glucose, UA: NEGATIVE mg/dL
Ketones, ur: NEGATIVE mg/dL
Leukocytes,Ua: NEGATIVE
Nitrite: NEGATIVE
Protein, ur: NEGATIVE mg/dL
Specific Gravity, Urine: 1.02 (ref 1.005–1.030)
pH: 7 (ref 5.0–8.0)

## 2021-06-19 LAB — URINALYSIS, MICROSCOPIC (REFLEX)

## 2021-06-19 LAB — PREGNANCY, URINE: Preg Test, Ur: NEGATIVE

## 2021-06-19 MED ORDER — KETOROLAC TROMETHAMINE 30 MG/ML IJ SOLN
30.0000 mg | Freq: Once | INTRAMUSCULAR | Status: AC
  Administered 2021-06-19: 30 mg via INTRAVENOUS
  Filled 2021-06-19: qty 1

## 2021-06-19 MED ORDER — OXYCODONE-ACETAMINOPHEN 5-325 MG PO TABS
1.0000 | ORAL_TABLET | Freq: Four times a day (QID) | ORAL | 0 refills | Status: DC | PRN
  Filled 2021-06-19: qty 12, 3d supply, fill #0

## 2021-06-19 MED ORDER — TAMSULOSIN HCL 0.4 MG PO CAPS: 0.4000 mg | ORAL_CAPSULE | Freq: Every day | ORAL | 0 refills | Status: AC

## 2021-06-19 MED ORDER — OXYCODONE-ACETAMINOPHEN 5-325 MG PO TABS: 1.0000 | ORAL_TABLET | Freq: Four times a day (QID) | ORAL | 0 refills | Status: DC | PRN

## 2021-06-19 MED ORDER — SODIUM CHLORIDE 0.9 % IV BOLUS
250.0000 mL | Freq: Once | INTRAVENOUS | Status: AC
  Administered 2021-06-19: 250 mL via INTRAVENOUS

## 2021-06-19 NOTE — ED Provider Notes (Signed)
[facility] EMERGENCY DEPARTMENT Provider Note   CSN: [id number] Arrival date & time: 06/19/21  0857     History  Chief Complaint  Patient presents with   Flank Pain    Sabrina Owen is a 46 y.o. female with a past medical history of relapsing remitting MS presenting today with complaint of left flank pain.  Says that it started last night and she was unable to get any sleep.  Is nauseated but no episodes of emesis.  Some difficulty with urination but no dysuria or hematuria.  She was diagnosed with a 1.1 cm left-sided kidney stone at the end of 2022.  There were no interventions made at that time but she is pretty certain this is what is bothering her.  Flank Pain This is a new problem. The problem occurs constantly. The problem has been gradually worsening. She has tried acetaminophen and a cold compress for the symptoms. The treatment provided no relief.       Home Medications Prior to Admission medications   Medication Sig Start Date End Date Taking? Authorizing Provider  doxycycline (VIBRA-TABS) 100 MG tablet Take 1 tablet (100 mg total) by mouth 2 (two) times daily. 01/11/21   Elenore Paddy, NP  FLUoxetine (PROZAC) 20 MG capsule Take 3 capsules by mouth daily. 01/13/13   [provider]  fluticasone (FLONASE) 50 MCG/ACT nasal spray Place 2 sprays into both nostrils daily. 01/08/21   Couture, Cortni S, PA-C  guaiFENesin-codeine 100-10 MG/5ML syrup Take 5 mLs by mouth 2 (two) times daily as needed for cough. 01/11/21   Elenore Paddy, NP  Multiple Vitamin (MULTI-VITAMIN) tablet Take 1 tablet by mouth daily.    [provider]  thiamine (VITAMIN B-1) 50 MG tablet Take 1 tablet (50 mg total) by mouth daily. 11/24/20   [name], MD  valACYclovir (VALTREX) 500 MG tablet Take 1 tablet by mouth daily. 08/29/12   [provider]  zinc gluconate 50 MG tablet Take 1 tablet (50 mg total) by mouth daily. 11/24/20   [name], MD      Allergies     Topamax [topiramate]    Review of Systems   Review of Systems  Gastrointestinal:  Positive for nausea. Negative for diarrhea and vomiting.  Genitourinary:  Positive for difficulty urinating and flank pain. Negative for dysuria and hematuria.    Physical Exam Updated Vital Signs BP (!) 125/95 (BP Location: Right Arm)   Pulse 73   Temp 98.3 F (36.8 C) (Oral)   Resp 18   Ht 5\' 9"  (1.753 m)   Wt 75.8 kg   SpO2 100%   BMI 24.66 kg/m  Physical Exam Vitals and nursing note reviewed.  Constitutional:      Appearance: Normal appearance.  HENT:     Head: Normocephalic and atraumatic.  Eyes:     General: No scleral icterus.    Conjunctiva/sclera: Conjunctivae normal.  Pulmonary:     Effort: Pulmonary effort is normal. No respiratory distress.  Abdominal:     General: Abdomen is flat.     Palpations: Abdomen is soft.     Tenderness: There is no abdominal tenderness. There is left CVA tenderness. There is no right CVA tenderness.  Skin:    General: Skin is warm and dry.     Findings: No rash.  Neurological:     Mental Status: She is alert.  Psychiatric:        Mood and Affect: Mood normal.  ED Results / Procedures / Treatments   Labs (all labs ordered are listed, but only abnormal results are displayed) Labs Reviewed  URINALYSIS, ROUTINE W REFLEX MICROSCOPIC - Abnormal; Notable for the following components:      Result Value   Hgb urine dipstick TRACE (*)    All other components within normal limits  BASIC METABOLIC PANEL - Abnormal; Notable for the following components:   Creatinine, Ser 1.15 (*)    All other components within normal limits  CBC - Abnormal; Notable for the following components:   Hemoglobin 11.9 (*)    All other components within normal limits  URINALYSIS, MICROSCOPIC (REFLEX) - Abnormal; Notable for the following components:   Bacteria, UA RARE (*)    All other components within normal limits  PREGNANCY, URINE    EKG None  Radiology No  results found.  Procedures Procedures   Medications Ordered in ED Medications - No data to display  ED Course/ Medical Decision Making/ A&P                           Medical Decision Making Amount and/or Complexity of Data Reviewed Labs: ordered. Radiology: ordered.  Risk Prescription drug management.   This patient presents to the ED for concern of flank pain. The differential diagnosis of emergent flank pain includes, but is not limited to nephrolithiasis, renal colic, pyelonephritis, UTI, ovarian torsion, ruptured ovarian cyst, dissection   This is not an exhaustive differential.    Past Medical History / Co-morbidities / Social History: MS   Additional history: Per chart review, patient was diagnosed with a 1.1 cm stone in September on a CT abdomen pelvis.  Patient elected to not have further management until it became symptomatic.   Physical Exam: Pertinent physical exam findings include positive left-sided CVA  Lab Tests: I ordered, and personally interpreted labs.  The pertinent results include:  -Creatinine 1.15, slight bump but normal GFR -hematuria, no bacteria -No white count   Imaging Studies: I ordered and independently visualized and interpreted imaging which showed 0.8 mm left-sided kidney stone with mild hydronephrosis. I agree with the radiologist interpretation.   Medications: I ordered medication including Toradol. Reevaluation of the patient after these medicines showed that the patient improved. I have reviewed the patients home medicines and have made adjustments as needed.  Disposition: After consideration of the diagnostic results and the patients response to treatment, I feel that the patient is stable for discharge home with a strainer, Percocet and Flomax.  She is nontoxic-appearing, no white count, afebrile and tolerating p.o. intake.  She will also be given contact information for urology in the event that she has been able to pass the stone  on her own.   Final Clinical Impression(s) / ED Diagnoses Final diagnoses:  Nephrolithiasis    Rx / DC Orders ED Discharge Orders          Ordered    tamsulosin (FLOMAX) 0.4 MG CAPS capsule  Daily after breakfast        06/19/21 1003    oxyCODONE-acetaminophen (PERCOCET/ROXICET) 5-325 MG tablet  Every 6 hours PRN        06/19/21 1003           Results and diagnoses were explained to the patient. Return precautions discussed in full. Patient had no additional questions and expressed complete understanding.   This chart was dictated using voice recognition software.  Despite best efforts to proofread,  errors can  occur which can change the documentation meaning.    Saddie Benders, PA-C 06/19/21 1048    Tanda Rockers A, DO 06/19/21 2052

## 2021-06-19 NOTE — Discharge Instructions (Addendum)
You have a 48mm stone.  This is decreased in size from the 1.1 cm stone you had at the end of 2022.    I have sent Percocet to your pharmacy for severe pain.  Remember you may not drive on this medication and it is well controlled since.  Keep it away from others.  Do not take Tylenol at the same time because Tylenol starting.  Tamsulosin is the medication that will hopefully help you pass the stone.  He should take this once a day after breakfast.  There is a urology office attached to these papers for you to call in a week if you are still having pain and do not feel as though you are passing the stone.

## 2021-06-19 NOTE — ED Notes (Signed)
Patient transported to CT 

## 2021-06-19 NOTE — ED Triage Notes (Signed)
C/O left flank pain started last night around 9 pm; concern for enlarging kidney stone, hx of same last 09/22; states some discomfort with voiding.

## 2021-08-28 ENCOUNTER — Telehealth: Payer: Self-pay | Admitting: Internal Medicine

## 2021-08-28 NOTE — Telephone Encounter (Signed)
Noted  

## 2021-08-28 NOTE — Telephone Encounter (Signed)
[place] a Investment banker, operational from [place] called to advise he will be faxing over some information in regards to [place] qualifying for an assistance program. He said if the provider has any questions he can be reached at [phone]. He also stated that if Jacari ever needs any assistance help she can contact the EAP: [employer] at [phone].  He also said if she needs behavioral health information she can contact Behavior health health care management at [phone].    Fyi

## 2021-10-04 ENCOUNTER — Emergency Department (HOSPITAL_BASED_OUTPATIENT_CLINIC_OR_DEPARTMENT_OTHER)
Admission: EM | Admit: 2021-10-04 | Discharge: 2021-10-04 | Disposition: A | Discharge status: Other Acute Inpatient Hospital | Payer: 59 | Attending: Emergency Medicine | Admitting: Emergency Medicine

## 2021-10-04 ENCOUNTER — Other Ambulatory Visit: Payer: Self-pay

## 2021-10-04 ENCOUNTER — Emergency Department (HOSPITAL_BASED_OUTPATIENT_CLINIC_OR_DEPARTMENT_OTHER): Payer: 59

## 2021-10-04 ENCOUNTER — Encounter (HOSPITAL_BASED_OUTPATIENT_CLINIC_OR_DEPARTMENT_OTHER): Payer: Self-pay

## 2021-10-04 DIAGNOSIS — R1013 Epigastric pain: Secondary | ICD-10-CM | POA: Diagnosis not present

## 2021-10-04 DIAGNOSIS — R140 Abdominal distension (gaseous): Secondary | ICD-10-CM | POA: Diagnosis not present

## 2021-10-04 DIAGNOSIS — R112 Nausea with vomiting, unspecified: Secondary | ICD-10-CM | POA: Insufficient documentation

## 2021-10-04 DIAGNOSIS — R1033 Periumbilical pain: Secondary | ICD-10-CM | POA: Diagnosis not present

## 2021-10-04 LAB — URINALYSIS, MICROSCOPIC (REFLEX): WBC, UA: NONE SEEN WBC/hpf (ref 0–5)

## 2021-10-04 LAB — URINALYSIS, ROUTINE W REFLEX MICROSCOPIC
Bilirubin Urine: NEGATIVE
Glucose, UA: NEGATIVE mg/dL
Ketones, ur: NEGATIVE mg/dL
Leukocytes,Ua: NEGATIVE
Nitrite: NEGATIVE
Protein, ur: NEGATIVE mg/dL
Specific Gravity, Urine: 1.015 (ref 1.005–1.030)
pH: 5.5 (ref 5.0–8.0)

## 2021-10-04 LAB — CBC WITH DIFFERENTIAL/PLATELET
Abs Immature Granulocytes: 0.02 10*3/uL (ref 0.00–0.07)
Basophils Absolute: 0 10*3/uL (ref 0.0–0.1)
Basophils Relative: 1 %
Eosinophils Absolute: 0.2 10*3/uL (ref 0.0–0.5)
Eosinophils Relative: 2 %
HCT: 36.2 % (ref 36.0–46.0)
Hemoglobin: 11.7 g/dL — ABNORMAL LOW (ref 12.0–15.0)
Immature Granulocytes: 0 %
Lymphocytes Relative: 23 %
Lymphs Abs: 1.6 10*3/uL (ref 0.7–4.0)
MCH: 29.9 pg (ref 26.0–34.0)
MCHC: 32.3 g/dL (ref 30.0–36.0)
MCV: 92.6 fL (ref 80.0–100.0)
Monocytes Absolute: 0.8 10*3/uL (ref 0.1–1.0)
Monocytes Relative: 12 %
Neutro Abs: 4.1 10*3/uL (ref 1.7–7.7)
Neutrophils Relative %: 62 %
Platelets: 364 10*3/uL (ref 150–400)
RBC: 3.91 MIL/uL (ref 3.87–5.11)
RDW: 14.5 % (ref 11.5–15.5)
WBC: 6.7 10*3/uL (ref 4.0–10.5)
nRBC: 0 % (ref 0.0–0.2)

## 2021-10-04 LAB — COMPREHENSIVE METABOLIC PANEL
ALT: 26 U/L (ref 0–44)
AST: 40 U/L (ref 15–41)
Albumin: 3.9 g/dL (ref 3.5–5.0)
Alkaline Phosphatase: 73 U/L (ref 38–126)
Anion gap: 7 (ref 5–15)
BUN: 11 mg/dL (ref 6–20)
CO2: 28 mmol/L (ref 22–32)
Calcium: 9.2 mg/dL (ref 8.9–10.3)
Chloride: 104 mmol/L (ref 98–111)
Creatinine, Ser: 0.79 mg/dL (ref 0.44–1.00)
GFR, Estimated: >60 mL/min (ref >60)
Glucose, Bld: 83 mg/dL (ref 70–99)
Potassium: 4.1 mmol/L (ref 3.5–5.1)
Sodium: 139 mmol/L (ref 135–145)
Total Bilirubin: 0.4 mg/dL (ref 0.3–1.2)
Total Protein: 7.5 g/dL (ref 6.5–8.1)

## 2021-10-04 LAB — LIPASE, BLOOD: Lipase: 51 U/L (ref 11–51)

## 2021-10-04 MED ORDER — ONDANSETRON HCL 4 MG/2ML IJ SOLN
4.0000 mg | Freq: Once | INTRAMUSCULAR | Status: AC
  Administered 2021-10-04: 4 mg via INTRAVENOUS
  Filled 2021-10-04: qty 2

## 2021-10-04 MED ORDER — IOHEXOL 300 MG/ML  SOLN
100.0000 mL | Freq: Once | INTRAMUSCULAR | Status: AC | PRN
  Administered 2021-10-04: 100 mL via INTRAVENOUS

## 2021-10-04 MED ORDER — MORPHINE SULFATE (PF) 4 MG/ML IV SOLN
4.0000 mg | Freq: Once | INTRAVENOUS | Status: AC
  Administered 2021-10-04: 4 mg via INTRAVENOUS
  Filled 2021-10-04: qty 1

## 2021-10-04 MED ORDER — SODIUM CHLORIDE 0.9 % IV BOLUS
1000.0000 mL | Freq: Once | INTRAVENOUS | Status: AC
  Administered 2021-10-04: 1000 mL via INTRAVENOUS

## 2021-10-04 NOTE — ED Notes (Signed)
45yo female presents with epigastric pain and increased belching for the past two days, has some abd pain. Abd soft and BS x 4 easily auscultated. 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

## 2021-10-04 NOTE — ED Triage Notes (Addendum)
Pt reports upper abdominal pain for one week. Pain in epigastric area. Pain increasingly getting worse. Reports burping/belching yesterday and burning after eating  Vomiting last night

## 2021-10-04 NOTE — Progress Notes (Signed)
RT unable to obtain IV access. 

## 2021-10-04 NOTE — ED Provider Notes (Signed)
[facility] EMERGENCY DEPARTMENT Provider Note   CSN: [id number] Arrival date & time: 10/04/21  1751     History  Chief Complaint  Patient presents with   Abdominal Pain    Sabrina Owen is a 46 y.o. female.  Patient presents to the hospital complaining of epigastric and peri-umbilical pain which began 1 week ago.  Patient states that she has had increasing pain in that area since onset.  She denies eating anything strange or performing any strange physical activity prior to onset.  She currently describes pain as sharp and rates as 7 out of 10 in severity.  She states that yesterday she began to feel nauseated and had 1 episode of emesis after eating a piece of pizza.  The patient has history of bariatric surgery approximately 2 years ago and states that she has had no significant GI issues for the past year and a half or more.  Past medical history significant for lupus, MS, history gastric bypass, tubal ligation  HPI     Home Medications Prior to Admission medications   Medication Sig Start Date End Date Taking? Authorizing Provider  doxycycline (VIBRA-TABS) 100 MG tablet Take 1 tablet (100 mg total) by mouth 2 (two) times daily. 01/11/21   Ailene Ards, NP  FLUoxetine (PROZAC) 20 MG capsule Take 3 capsules by mouth daily. 01/13/13   [provider]  fluticasone (FLONASE) 50 MCG/ACT nasal spray Place 2 sprays into both nostrils daily. 01/08/21   Couture, Cortni S, PA-C  guaiFENesin-codeine 100-10 MG/5ML syrup Take 5 mLs by mouth 2 (two) times daily as needed for cough. 01/11/21   Ailene Ards, NP  Multiple Vitamin (MULTI-VITAMIN) tablet Take 1 tablet by mouth daily.    [provider]  oxyCODONE-acetaminophen (PERCOCET/ROXICET) 5-325 MG tablet Take 1 tablet by mouth every 6 (six) hours as needed for severe pain. 06/19/21   Redwine, Madison A, PA-C  thiamine (VITAMIN B-1) 50 MG tablet Take 1 tablet (50 mg total) by mouth daily. 11/24/20   Janith Lima, MD   valACYclovir (VALTREX) 500 MG tablet Take 1 tablet by mouth daily. 08/29/12   [provider]  zinc gluconate 50 MG tablet Take 1 tablet (50 mg total) by mouth daily. 11/24/20   Janith Lima, MD      Allergies    Topamax [topiramate]    Review of Systems   Review of Systems  Gastrointestinal:  Positive for abdominal pain, nausea and vomiting. Negative for constipation and diarrhea.  Genitourinary:  Negative for dysuria, flank pain and hematuria.    Physical Exam Updated Vital Signs BP (!) 153/81 (BP Location: Left Arm)   Pulse 65   Temp 98.3 F (36.8 C) (Oral)   Resp 20   Ht 5\' 8"  (1.727 m)   Wt 71.2 kg   SpO2 98%   BMI 23.87 kg/m  Physical Exam Vitals and nursing note reviewed.  Constitutional:      Appearance: She is normal weight.  HENT:     Head: Normocephalic and atraumatic.  Eyes:     Extraocular Movements: Extraocular movements intact.  Cardiovascular:     Rate and Rhythm: Normal rate and regular rhythm.     Heart sounds: Normal heart sounds.  Pulmonary:     Effort: Pulmonary effort is normal.     Breath sounds: Normal breath sounds.  Abdominal:     General: There is distension.     Palpations: Abdomen is soft.     Tenderness: There is  abdominal tenderness in the epigastric area and periumbilical area.  Skin:    General: Skin is warm and dry.     Capillary Refill: Capillary refill takes less than 2 seconds.  Neurological:     Mental Status: She is alert and oriented to person, place, and time.     ED Results / Procedures / Treatments   Labs (all labs ordered are listed, but only abnormal results are displayed) Labs Reviewed  CBC WITH DIFFERENTIAL/PLATELET - Abnormal; Notable for the following components:      Result Value   Hemoglobin 11.7 (*)    All other components within normal limits  URINALYSIS, ROUTINE W REFLEX MICROSCOPIC - Abnormal; Notable for the following components:   Hgb urine dipstick TRACE (*)    All other components  within normal limits  URINALYSIS, MICROSCOPIC (REFLEX) - Abnormal; Notable for the following components:   Bacteria, UA RARE (*)    All other components within normal limits  COMPREHENSIVE METABOLIC PANEL  LIPASE, BLOOD    EKG None  Radiology CT Abdomen Pelvis W Contrast  Result Date: 10/04/2021 CLINICAL DATA:  Epigastric pain. EXAM: CT ABDOMEN AND PELVIS WITH CONTRAST TECHNIQUE: Multidetector CT imaging of the abdomen and pelvis was performed using the standard protocol following bolus administration of intravenous contrast. RADIATION DOSE REDUCTION: This exam was performed according to the departmental dose-optimization program which includes automated exposure control, adjustment of the mA and/or kV according to patient size and/or use of iterative reconstruction technique. CONTRAST:  135mL OMNIPAQUE IOHEXOL 300 MG/ML  SOLN COMPARISON:  June 19, 2021 FINDINGS: Lower chest: No acute abnormality. Hepatobiliary: No focal liver abnormality is seen. No gallstones, gallbladder wall thickening, or biliary dilatation. Pancreas: Unremarkable. No pancreatic ductal dilatation or surrounding inflammatory changes. Spleen: Normal in size without focal abnormality. Adrenals/Urinary Tract: Adrenal glands are unremarkable. Kidneys are normal in size, without focal lesions. An 8 mm renal calculus is seen within the distal left ureter (axial CT image 54, CT series 607). Moderate size left-sided hydronephrosis and hydroureter are also present. A moderate amount of contrast is seen within the dependent portion of an otherwise normal appearing urinary bladder. Stomach/Bowel: Multiple surgical sutures are seen within the gastric region. Surgically anastomosed bowel is also seen within the anterior aspect of the mid to lower left abdomen. The appendix is not clearly identified. No evidence of bowel wall thickening, distention, or inflammatory changes. There is twisting of the mesentery of the mid and lower abdomen, along  the midline (axial CT images 33 through 51, CT series 607). This was seen on the prior study, but to a much lesser degree (axial CT images 41 through 45, CT series 2 of the prior study). Vascular/Lymphatic: No significant vascular findings are present. No enlarged abdominal or pelvic lymph nodes. Reproductive: A predominant stable 3.9 cm x 2.9 cm exophytic posterior left-sided uterine fibroid is seen. The bilateral adnexa are unremarkable. Other: No abdominal wall hernia or abnormality. No abdominopelvic ascites. Musculoskeletal: No acute or significant osseous findings. IMPRESSION: 1. Twisting of the mesentery of the mid and lower abdomen, without evidence of bowel dilatation. While this may be transient in nature, the presence of an internal hernia cannot be excluded. 2. 8 mm left ureteral calculus with left-sided hydronephrosis and hydroureter. 3. Evidence of prior gastric bypass surgery. 4. Stable exophytic left-sided uterine fibroid. Electronically Signed   By: Virgina Norfolk M.D.   On: 10/04/2021 21:33    Procedures Procedures    Medications Ordered in ED Medications  morphine (PF)  4 MG/ML injection 4 mg (4 mg Intravenous Given 10/04/21 2014)  ondansetron (ZOFRAN) injection 4 mg (4 mg Intravenous Given 10/04/21 2015)  sodium chloride 0.9 % bolus 1,000 mL (1,000 mLs Intravenous New Bag/Given 10/04/21 2020)  iohexol (OMNIPAQUE) 300 MG/ML solution 100 mL (100 mLs Intravenous Contrast Given 10/04/21 2031)  morphine (PF) 4 MG/ML injection 4 mg (4 mg Intravenous Given 10/04/21 2210)    ED Course/ Medical Decision Making/ A&P                           Medical Decision Making Amount and/or Complexity of Data Reviewed Labs: ordered. Radiology: ordered.  Risk Prescription drug management.   This patient presents to the ED for concern of epigastric pain, this involves an extensive number of treatment options, and is a complaint that carries with it a high risk of complications and morbidity.  The  differential diagnosis includes cholecystitis, appendicitis, pancreatitis, surgical complications, mesenteric ischemia, and others   Co morbidities that complicate the patient evaluation  History of gastric bypass   Additional history obtained:  Additional history obtained from family at bedside External records from outside source obtained and reviewed including note documenting gastric bypass surgery on April 26, 2019 with Dr. Toney Rakes   Lab Tests:  I Ordered, and personally interpreted labs.  The pertinent results include: Urinalysis with no sign of infection, lipase 51, unremarkable CBC, unremarkable CMP   Imaging Studies ordered:  I ordered imaging studies including CT abdomen pelvis with contrast I independently visualized and interpreted imaging which showed  1. Twisting of the mesentery of the mid and lower abdomen, without  evidence of bowel dilatation. While this may be transient in nature,  the presence of an internal hernia cannot be excluded.  2. 8 mm left ureteral calculus with left-sided hydronephrosis and  hydroureter.  3. Evidence of prior gastric bypass surgery.  4. Stable exophytic left-sided uterine fibroid.   I agree with the radiologist interpretation   Consultations Obtained:  I requested consultation with the bariatric surgeon at [facility], Dr. Alvan Dame, and discussed lab and imaging findings as well as pertinent plan - they recommend: ED to ED transfer to [facility] with disc with imaging   Problem List / ED Course / Critical interventions / Medication management   I ordered medication including morphine for pain, Zofran for nausea, saline for hydration Reevaluation of the patient after these medicines showed that the patient improved I have reviewed the patients home medicines and have made adjustments as needed   Test / Admission - Considered:  Patient presents with a possible internal hernia at her prior surgical site  which further evaluation and management by bariatric surgery.  Patient transferred to [facility] [place]        Final Clinical Impression(s) / ED Diagnoses Final diagnoses:  Epigastric pain    Rx / DC Orders ED Discharge Orders     None         Ronny Bacon 10/04/21 2217    Drenda Freeze, MD 10/05/21 1500

## 2021-11-08 ENCOUNTER — Inpatient Hospital Stay: Payer: 59 | Admitting: Family Medicine

## 2021-12-03 ENCOUNTER — Telehealth: Payer: 59 | Admitting: Nurse Practitioner

## 2021-12-03 DIAGNOSIS — N3000 Acute cystitis without hematuria: Secondary | ICD-10-CM | POA: Diagnosis not present

## 2021-12-03 MED ORDER — CEPHALEXIN 500 MG PO CAPS: 500.0000 mg | ORAL_CAPSULE | Freq: Two times a day (BID) | ORAL | 0 refills | Status: DC

## 2021-12-03 NOTE — Progress Notes (Signed)
Virtual Visit Consent   Sabrina Owen, you are scheduled for a virtual visit with a [facility] provider today. Just as with appointments in the office, your consent must be obtained to participate. Your consent will be active for this visit and any virtual visit you may have with one of our providers in the next 365 days. If you have a MyChart account, a copy of this consent can be sent to you electronically.  As this is a virtual visit, video technology does not allow for your provider to perform a traditional examination. This may limit your provider's ability to fully assess your condition. If your provider identifies any concerns that need to be evaluated in person or the need to arrange testing (such as labs, EKG, etc.), we will make arrangements to do so. Although advances in technology are sophisticated, we cannot ensure that it will always work on either your end or our end. If the connection with a video visit is poor, the visit may have to be switched to a telephone visit. With either a video or telephone visit, we are not always able to ensure that we have a secure connection.  By engaging in this virtual visit, you consent to the provision of healthcare and authorize for your insurance to be billed (if applicable) for the services provided during this visit. Depending on your insurance coverage, you may receive a charge related to this service.  I need to obtain your verbal consent now. Are you willing to proceed with your visit today? Colbie Sliker has provided verbal consent on 12/03/2021 for a virtual visit (video or telephone). Viviano Simas, FNP  Date: 12/03/2021 6:57 PM  Virtual Visit via Video Note   I, Viviano Simas, connected with  Kymberly Blomberg  ([id number], [birth year]) on 12/03/21 at  7:00 PM EST by a video-enabled telemedicine application and verified that I am speaking with the correct person using two identifiers.  Location: Patient: Virtual Visit Location Patient:  Home Provider: Virtual Visit Location Provider: Home Office   I discussed the limitations of evaluation and management by telemedicine and the availability of in person appointments. The patient expressed understanding and agreed to proceed.    History of Present Illness: Sabrina Owen is a 46 y.o. who identifies as a female who was assigned female at birth, and is being seen today with complaints of urinary urgency and pain and burning with urination. This has been occurring for the past 3 days.   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   Anemia due to acquired thiamine deficiency 11/24/2020   Deficiency anemia 11/21/2020   Gastric bypass status for obesity 11/21/2020   MS (multiple sclerosis) (HCC)     Allergies:  Allergies  Allergen Reactions   Topamax [Topiramate]    Medications:  Current Outpatient Medications:    doxycycline (VIBRA-TABS) 100 MG tablet, Take 1 tablet (100 mg total) by mouth 2 (two) times daily., Disp: 20 tablet, Rfl: 0   FLUoxetine (PROZAC) 20 MG capsule, Take 3 capsules by mouth daily., Disp: , Rfl:    fluticasone (FLONASE) 50 MCG/ACT nasal spray, Place 2 sprays into both nostrils daily., Disp: 16 g, Rfl: 0   guaiFENesin-codeine 100-10 MG/5ML syrup, Take 5 mLs  by mouth 2 (two) times daily as needed for cough., Disp: 120 mL, Rfl: 0   Multiple Vitamin (MULTI-VITAMIN) tablet, Take 1 tablet by mouth daily., Disp: , Rfl:    oxyCODONE-acetaminophen (PERCOCET/ROXICET) 5-325 MG tablet, Take 1 tablet by mouth every 6 (six) hours as needed for severe pain., Disp: 12 tablet, Rfl: 0   thiamine (VITAMIN B-1) 50 MG tablet, Take 1 tablet (50 mg total) by mouth daily., Disp: 90 tablet, Rfl: 1   valACYclovir  (VALTREX) 500 MG tablet, Take 1 tablet by mouth daily., Disp: , Rfl:    zinc gluconate 50 MG tablet, Take 1 tablet (50 mg total) by mouth daily., Disp: 90 tablet, Rfl: 1  Observations/Objective: Patient is well-developed, well-nourished in no acute distress.  Resting comfortably  at home.  Head is normocephalic, atraumatic.  No labored breathing.  Speech is clear and coherent with logical content.  Patient is alert and oriented at baseline.    Assessment and Plan: 1. Acute cystitis without hematuria Push fluids avoid alcohol sugar and caffeine beverages  - cephALEXin (KEFLEX) 500 MG capsule; Take 1 capsule (500 mg total) by mouth 2 (two) times daily for 7 days.  Dispense: 14 capsule; Refill: 0     Follow Up Instructions: I discussed the assessment and treatment plan with the patient. The patient was provided an opportunity to ask questions and all were answered. The patient agreed with the plan and demonstrated an understanding of the instructions.  A copy of instructions were sent to the patient via MyChart unless otherwise noted below.    The patient was advised to call back or seek an in-person evaluation if the symptoms worsen or if the condition fails to improve as anticipated.  Time:  I spent 10 minutes with the patient via telehealth technology discussing the above problems/concerns.    Viviano Simas, FNP

## 2021-12-06 ENCOUNTER — Encounter: Payer: Self-pay | Admitting: Nurse Practitioner

## 2021-12-08 ENCOUNTER — Encounter: Payer: Self-pay | Admitting: Internal Medicine

## 2021-12-10 ENCOUNTER — Encounter: Payer: Self-pay | Admitting: Internal Medicine

## 2021-12-10 ENCOUNTER — Ambulatory Visit: Payer: 59 | Admitting: Internal Medicine

## 2021-12-10 VITALS — BP 136/86 | HR 62 | Temp 98.2°F | Resp 16 | Ht 68.0 in | Wt 169.0 lb

## 2021-12-10 DIAGNOSIS — R300 Dysuria: Secondary | ICD-10-CM | POA: Diagnosis not present

## 2021-12-10 DIAGNOSIS — N760 Acute vaginitis: Secondary | ICD-10-CM

## 2021-12-10 DIAGNOSIS — D539 Nutritional anemia, unspecified: Secondary | ICD-10-CM | POA: Diagnosis not present

## 2021-12-10 DIAGNOSIS — D511 Vitamin B12 deficiency anemia due to selective vitamin B12 malabsorption with proteinuria: Secondary | ICD-10-CM | POA: Insufficient documentation

## 2021-12-10 DIAGNOSIS — B3731 Acute candidiasis of vulva and vagina: Secondary | ICD-10-CM | POA: Diagnosis not present

## 2021-12-10 DIAGNOSIS — Z23 Encounter for immunization: Secondary | ICD-10-CM | POA: Diagnosis not present

## 2021-12-10 DIAGNOSIS — B9689 Other specified bacterial agents as the cause of diseases classified elsewhere: Secondary | ICD-10-CM

## 2021-12-10 MED ORDER — FLUCONAZOLE 150 MG PO TABS: 150.0000 mg | ORAL_TABLET | Freq: Every day | ORAL | 3 refills | Status: DC

## 2021-12-10 MED ORDER — CYANOCOBALAMIN 1000 MCG/ML IJ SOLN
1000.0000 ug | Freq: Once | INTRAMUSCULAR | Status: AC
  Administered 2021-12-10: 1000 ug via INTRAMUSCULAR

## 2021-12-10 NOTE — Progress Notes (Signed)
Subjective:  Patient ID: Sabrina Owen, female    DOB: [birth year]  Age: 46 y.o. MRN: [id number]  CC: Urinary Tract Infection   HPI Henretter Chakrabarti presents for f/up -  She was recently seen elsewhere for UTI and has been taking Keflex.  She continues to complain of dysuria and now has a vaginal itching.  She denies dysuria, hematuria, flank pain, fever, or chills.  Outpatient Medications Prior to Visit  Medication Sig Dispense Refill   FLUoxetine (PROZAC) 20 MG capsule Take 3 capsules by mouth daily.     fluticasone (FLONASE) 50 MCG/ACT nasal spray Place 2 sprays into both nostrils daily. 16 g 0   Multiple Vitamin (MULTI-VITAMIN) tablet Take 1 tablet by mouth daily.     valACYclovir (VALTREX) 500 MG tablet Take 1 tablet by mouth daily.     zinc gluconate 50 MG tablet Take 1 tablet (50 mg total) by mouth daily. 90 tablet 1   cephALEXin (KEFLEX) 500 MG capsule Take 1 capsule (500 mg total) by mouth 2 (two) times daily for 7 days. 14 capsule 0   oxyCODONE-acetaminophen (PERCOCET/ROXICET) 5-325 MG tablet Take 1 tablet by mouth every 6 (six) hours as needed for severe pain. 12 tablet 0   doxycycline (VIBRA-TABS) 100 MG tablet Take 1 tablet (100 mg total) by mouth 2 (two) times daily. 20 tablet 0   guaiFENesin-codeine 100-10 MG/5ML syrup Take 5 mLs by mouth 2 (two) times daily as needed for cough. 120 mL 0   thiamine (VITAMIN B-1) 50 MG tablet Take 1 tablet (50 mg total) by mouth daily. 90 tablet 1   No facility-administered medications prior to visit.    ROS Review of Systems  Constitutional: Negative.  Negative for appetite change, chills, fatigue and fever.  HENT: Negative.    Eyes: Negative.   Respiratory: Negative.  Negative for cough, chest tightness, shortness of breath and wheezing.   Cardiovascular:  Negative for chest pain, palpitations and leg swelling.  Gastrointestinal:  Negative for abdominal pain, diarrhea and nausea.  Endocrine: Negative.   Genitourinary:  Negative for  difficulty urinating, dysuria, hematuria, urgency, vaginal bleeding, vaginal discharge and vaginal pain.       ++ vaginal itching  Musculoskeletal:  Negative for arthralgias and myalgias.  Neurological: Negative.   Hematological:  Negative for adenopathy. Does not bruise/bleed easily.  Psychiatric/Behavioral: Negative.      Objective:  BP 136/86 (BP Location: Left Arm, Patient Position: Sitting, Cuff Size: Large)   Pulse 62   Temp 98.2 F (36.8 C) (Oral)   Resp 16   Ht 5\' 8"  (1.727 m)   Wt 169 lb (76.7 kg)   SpO2 99%   BMI 25.70 kg/m   BP Readings from Last 3 Encounters:  12/10/21 136/86  10/04/21 (!) 147/101  06/19/21 (!) 125/95    Wt Readings from Last 3 Encounters:  12/10/21 169 lb (76.7 kg)  10/04/21 157 lb (71.2 kg)  06/19/21 167 lb (75.8 kg)    Physical Exam Vitals reviewed.  HENT:     Mouth/Throat:     Mouth: Mucous membranes are moist.  Eyes:     General: No scleral icterus.    Conjunctiva/sclera: Conjunctivae normal.  Cardiovascular:     Rate and Rhythm: Normal rate and regular rhythm.     Heart sounds: No murmur heard. Pulmonary:     Effort: Pulmonary effort is normal.     Breath sounds: No stridor. No wheezing, rhonchi or rales.  Abdominal:     General: Abdomen is  flat.     Palpations: There is no mass.     Tenderness: There is no abdominal tenderness. There is no guarding.     Hernia: No hernia is present.  Musculoskeletal:        General: Normal range of motion.     Cervical back: Neck supple.     Right lower leg: No edema.     Left lower leg: No edema.  Lymphadenopathy:     Cervical: No cervical adenopathy.  Skin:    General: Skin is warm and dry.  Neurological:     General: No focal deficit present.     Mental Status: She is alert.  Psychiatric:        Mood and Affect: Mood normal.        Behavior: Behavior normal.     Lab Results  Component Value Date   WBC 6.7 10/04/2021   HGB 11.7 (L) 10/04/2021   HCT 36.2 10/04/2021   PLT  364 10/04/2021   GLUCOSE 83 10/04/2021   CHOL 131 07/11/2020   TRIG 56 07/11/2020   HDL 58 07/11/2020   LDLCALC 64 07/11/2020   ALT 26 10/04/2021   AST 40 10/04/2021   NA 139 10/04/2021   K 4.1 10/04/2021   CL 104 10/04/2021   CREATININE 0.79 10/04/2021   BUN 11 10/04/2021   CO2 28 10/04/2021   TSH 3.18 07/11/2020    CT Abdomen Pelvis W Contrast  Result Date: 10/04/2021 CLINICAL DATA:  Epigastric pain. EXAM: CT ABDOMEN AND PELVIS WITH CONTRAST TECHNIQUE: Multidetector CT imaging of the abdomen and pelvis was performed using the standard protocol following bolus administration of intravenous contrast. RADIATION DOSE REDUCTION: This exam was performed according to the departmental dose-optimization program which includes automated exposure control, adjustment of the mA and/or kV according to patient size and/or use of iterative reconstruction technique. CONTRAST:  OMNIPAQUE IOHEXOL 300 MG/ML  SOLN COMPARISON:  June 19, 2021 FINDINGS: Lower chest: No acute abnormality. Hepatobiliary: No focal liver abnormality is seen. No gallstones, gallbladder wall thickening, or biliary dilatation. Pancreas: Unremarkable. No pancreatic ductal dilatation or surrounding inflammatory changes. Spleen: Normal in size without focal abnormality. Adrenals/Urinary Tract: Adrenal glands are unremarkable. Kidneys are normal in size, without focal lesions. An 8 mm renal calculus is seen within the distal left ureter (axial CT image 54, CT series 607). Moderate size left-sided hydronephrosis and hydroureter are also present. A moderate amount of contrast is seen within the dependent portion of an otherwise normal appearing urinary bladder. Stomach/Bowel: Multiple surgical sutures are seen within the gastric region. Surgically anastomosed bowel is also seen within the anterior aspect of the mid to lower left abdomen. The appendix is not clearly identified. No evidence of bowel wall thickening, distention, or inflammatory  changes. There is twisting of the mesentery of the mid and lower abdomen, along the midline (axial CT images 33 through 51, CT series 607). This was seen on the prior study, but to a much lesser degree (axial CT images 41 through 45, CT series 2 of the prior study). Vascular/Lymphatic: No significant vascular findings are present. No enlarged abdominal or pelvic lymph nodes. Reproductive: A predominant stable 3.9 cm x 2.9 cm exophytic posterior left-sided uterine fibroid is seen. The bilateral adnexa are unremarkable. Other: No abdominal wall hernia or abnormality. No abdominopelvic ascites. Musculoskeletal: No acute or significant osseous findings. IMPRESSION: 1. Twisting of the mesentery of the mid and lower abdomen, without evidence of bowel dilatation. While this may be transient in  nature, the presence of an internal hernia cannot be excluded. 2. 8 mm left ureteral calculus with left-sided hydronephrosis and hydroureter. 3. Evidence of prior gastric bypass surgery. 4. Stable exophytic left-sided uterine fibroid. Electronically Signed   By: Virgina Norfolk M.D.   On: 10/04/2021 21:33    Assessment & Plan:   Eddis was seen today for urinary tract infection.  Diagnoses and all orders for this visit:  Dysuria- She still is white cells in her urine but the culture is negative.  Will treat for yeast vaginitis and BV. -     Urinalysis, Routine w reflex microscopic; Future -     CULTURE, URINE COMPREHENSIVE; Future -     CULTURE, URINE COMPREHENSIVE -     Urinalysis, Routine w reflex microscopic  Vaginal yeast infection -     fluconazole (DIFLUCAN) 150 MG tablet; Take 1 tablet (150 mg total) by mouth daily.  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  Other orders -     Flu Vaccine QUAD 6+ mos PF IM (Fluarix Quad PF)   I have discontinued Gisselle Nadeau's thiamine, doxycycline, guaiFENesin-codeine, oxyCODONE-acetaminophen, and cephALEXin. I am also having her start on fluconazole and metroNIDAZOLE. Additionally, I am having her maintain her FLUoxetine, Multi-Vitamin, valACYclovir, zinc gluconate, and fluticasone. We administered cyanocobalamin.  Meds ordered this encounter  Medications   fluconazole (DIFLUCAN) 150 MG tablet    Sig: Take 1 tablet (150 mg total) by mouth daily.    Dispense:  1 tablet    Refill:  3   cyanocobalamin (VITAMIN B12) injection 1,000 mcg   metroNIDAZOLE (FLAGYL) 500 MG tablet    Sig: Take 1 tablet (500 mg total) by mouth 2 (two) times daily for 7 days.    Dispense:  14 tablet    Refill:  0     Follow-up: Return in about 3 weeks (around 12/31/2021).  Scarlette Calico, MD

## 2021-12-10 NOTE — Patient Instructions (Signed)
Urinary Tract Infection, Adult  A urinary tract infection (UTI) is an infection of any part of the urinary tract. The urinary tract includes the kidneys, ureters, bladder, and urethra. These organs make, store, and get rid of urine in the body. An upper UTI affects the ureters and kidneys. A lower UTI affects the bladder and urethra. What are the causes? Most urinary tract infections are caused by bacteria in your genital area around your urethra, where urine leaves your body. These bacteria grow and cause inflammation of your urinary tract. What increases the risk? You are more likely to develop this condition if: You have a urinary catheter that stays in place. You are not able to control when you urinate or have a bowel movement (incontinence). You are female and you: Use a spermicide or diaphragm for birth control. Have low estrogen levels. Are pregnant. You have certain genes that increase your risk. You are sexually active. You take antibiotic medicines. You have a condition that causes your flow of urine to slow down, such as: An enlarged prostate, if you are female. Blockage in your urethra. A kidney stone. A nerve condition that affects your bladder control (neurogenic bladder). Not getting enough to drink, or not urinating often. You have certain medical conditions, such as: Diabetes. A weak disease-fighting system (immunesystem). Sickle cell disease. Gout. Spinal cord injury. What are the signs or symptoms? Symptoms of this condition include: Needing to urinate right away (urgency). Frequent urination. This may include small amounts of urine each time you urinate. Pain or burning with urination. Blood in the urine. Urine that smells bad or unusual. Trouble urinating. Cloudy urine. Vaginal discharge, if you are female. Pain in the abdomen or the lower back. You may also have: Vomiting or a decreased appetite. Confusion. Irritability or tiredness. A fever or  chills. Diarrhea. The first symptom in older adults may be confusion. In some cases, they may not have any symptoms until the infection has worsened. How is this diagnosed? This condition is diagnosed based on your medical history and a physical exam. You may also have other tests, including: Urine tests. Blood tests. Tests for STIs (sexually transmitted infections). If you have had more than one UTI, a cystoscopy or imaging studies may be done to determine the cause of the infections. How is this treated? Treatment for this condition includes: Antibiotic medicine. Over-the-counter medicines to treat discomfort. Drinking enough water to stay hydrated. If you have frequent infections or have other conditions such as a kidney stone, you may need to see a health care provider who specializes in the urinary tract (urologist). In rare cases, urinary tract infections can cause sepsis. Sepsis is a life-threatening condition that occurs when the body responds to an infection. Sepsis is treated in the hospital with IV antibiotics, fluids, and other medicines. Follow these instructions at home:  Medicines Take over-the-counter and prescription medicines only as told by your health care provider. If you were prescribed an antibiotic medicine, take it as told by your health care provider. Do not stop using the antibiotic even if you start to feel better. General instructions Make sure you: Empty your bladder often and completely. Do not hold urine for long periods of time. Empty your bladder after sex. Wipe from front to back after urinating or having a bowel movement if you are female. Use each tissue only one time when you wipe. Drink enough fluid to keep your urine pale yellow. Keep all follow-up visits. This is important. Contact a health   care provider if: Your symptoms do not get better after 1-2 days. Your symptoms go away and then return. Get help right away if: You have severe pain in  your back or your lower abdomen. You have a fever or chills. You have nausea or vomiting. Summary A urinary tract infection (UTI) is an infection of any part of the urinary tract, which includes the kidneys, ureters, bladder, and urethra. Most urinary tract infections are caused by bacteria in your genital area. Treatment for this condition often includes antibiotic medicines. If you were prescribed an antibiotic medicine, take it as told by your health care provider. Do not stop using the antibiotic even if you start to feel better. Keep all follow-up visits. This is important. This information is not intended to replace advice given to you by your health care provider. Make sure you discuss any questions you have with your health care provider. Document Revised: 08/05/2019 Document Reviewed: 08/05/2019 Elsevier Patient Education  2023 [employer]  

## 2021-12-11 LAB — URINALYSIS, ROUTINE W REFLEX MICROSCOPIC
Bilirubin Urine: NEGATIVE
Hgb urine dipstick: NEGATIVE
Ketones, ur: NEGATIVE
Leukocytes,Ua: NEGATIVE
Nitrite: NEGATIVE
Specific Gravity, Urine: 1.025 (ref 1.000–1.030)
Total Protein, Urine: NEGATIVE
Urine Glucose: NEGATIVE
Urobilinogen, UA: 0.2 (ref 0.0–1.0)
pH: 6 (ref 5.0–8.0)

## 2021-12-13 ENCOUNTER — Encounter: Payer: Self-pay | Admitting: Internal Medicine

## 2021-12-13 DIAGNOSIS — N760 Acute vaginitis: Secondary | ICD-10-CM | POA: Insufficient documentation

## 2021-12-13 DIAGNOSIS — B9689 Other specified bacterial agents as the cause of diseases classified elsewhere: Secondary | ICD-10-CM | POA: Insufficient documentation

## 2021-12-13 LAB — CULTURE, URINE COMPREHENSIVE: RESULT:: NO GROWTH

## 2021-12-13 MED ORDER — METRONIDAZOLE 500 MG PO TABS: 500.0000 mg | ORAL_TABLET | Freq: Two times a day (BID) | ORAL | 0 refills | Status: AC

## 2022-01-13 ENCOUNTER — Ambulatory Visit: Payer: 59 | Admitting: Internal Medicine

## 2022-01-13 ENCOUNTER — Encounter: Payer: Self-pay | Admitting: Internal Medicine

## 2022-01-13 VITALS — BP 118/70 | HR 70 | Temp 98.3°F | Resp 16 | Ht 68.0 in | Wt 167.0 lb

## 2022-01-13 DIAGNOSIS — R590 Localized enlarged lymph nodes: Secondary | ICD-10-CM | POA: Diagnosis not present

## 2022-01-13 DIAGNOSIS — D511 Vitamin B12 deficiency anemia due to selective vitamin B12 malabsorption with proteinuria: Secondary | ICD-10-CM | POA: Diagnosis not present

## 2022-01-13 DIAGNOSIS — H6502 Acute serous otitis media, left ear: Secondary | ICD-10-CM | POA: Diagnosis not present

## 2022-01-13 DIAGNOSIS — E519 Thiamine deficiency, unspecified: Secondary | ICD-10-CM

## 2022-01-13 DIAGNOSIS — D538 Other specified nutritional anemias: Secondary | ICD-10-CM | POA: Diagnosis not present

## 2022-01-13 LAB — C-REACTIVE PROTEIN: CRP: <1 mg/dL (ref 0.5–20.0)

## 2022-01-13 LAB — CBC WITH DIFFERENTIAL/PLATELET
Basophils Absolute: 0 10*3/uL (ref 0.0–0.1)
Basophils Relative: 1 % (ref 0.0–3.0)
Eosinophils Absolute: 0.2 10*3/uL (ref 0.0–0.7)
Eosinophils Relative: 3.7 % (ref 0.0–5.0)
HCT: 35.3 % — ABNORMAL LOW (ref 36.0–46.0)
Hemoglobin: 11.8 g/dL — ABNORMAL LOW (ref 12.0–15.0)
Lymphocytes Relative: 27.3 % (ref 12.0–46.0)
Lymphs Abs: 1.2 10*3/uL (ref 0.7–4.0)
MCHC: 33.4 g/dL (ref 30.0–36.0)
MCV: 93.3 fl (ref 78.0–100.0)
Monocytes Absolute: 0.7 10*3/uL (ref 0.1–1.0)
Monocytes Relative: 14.7 % — ABNORMAL HIGH (ref 3.0–12.0)
Neutro Abs: 2.4 10*3/uL (ref 1.4–7.7)
Neutrophils Relative %: 53.3 % (ref 43.0–77.0)
Platelets: 375 10*3/uL (ref 150.0–400.0)
RBC: 3.78 Mil/uL — ABNORMAL LOW (ref 3.87–5.11)
RDW: 15.4 % (ref 11.5–15.5)
WBC: 4.5 10*3/uL (ref 4.0–10.5)

## 2022-01-13 MED ORDER — AMOXICILLIN-POT CLAVULANATE 875-125 MG PO TABS: 1.0000 | ORAL_TABLET | Freq: Two times a day (BID) | ORAL | 0 refills | Status: AC

## 2022-01-13 MED ORDER — CYANOCOBALAMIN 1000 MCG/ML IJ SOLN
1000.0000 ug | Freq: Once | INTRAMUSCULAR | Status: AC
  Administered 2022-01-13: 1000 ug via INTRAMUSCULAR

## 2022-01-13 NOTE — Progress Notes (Signed)
Subjective:  Patient ID: Sabrina Owen, female    DOB: [birth year]  Age: 47 y.o. MRN: [id number]  CC: Anemia   HPI Sabrina Owen presents for f/up -  She complains of a 2-day history of sore throat, right-sided lymphadenopathy in her neck, and left ear pain.  Outpatient Medications Prior to Visit  Medication Sig Dispense Refill   fluconazole (DIFLUCAN) 150 MG tablet Take 1 tablet (150 mg total) by mouth daily. 1 tablet 3   FLUoxetine (PROZAC) 20 MG capsule Take 3 capsules by mouth daily.     fluticasone (FLONASE) 50 MCG/ACT nasal spray Place 2 sprays into both nostrils daily. 16 g 0   Multiple Vitamin (MULTI-VITAMIN) tablet Take 1 tablet by mouth daily.     valACYclovir (VALTREX) 500 MG tablet Take 1 tablet by mouth daily.     zinc gluconate 50 MG tablet Take 1 tablet (50 mg total) by mouth daily. 90 tablet 1   No facility-administered medications prior to visit.    ROS Review of Systems  Constitutional: Negative.  Negative for chills, diaphoresis, fatigue and fever.  HENT:  Positive for ear pain and sore throat. Negative for hearing loss, sinus pressure, sinus pain and trouble swallowing.   Eyes: Negative.   Respiratory:  Negative for cough, chest tightness, shortness of breath and wheezing.   Cardiovascular:  Negative for chest pain, palpitations and leg swelling.  Gastrointestinal:  Negative for abdominal pain, constipation, diarrhea, nausea and vomiting.  Endocrine: Negative.   Genitourinary: Negative.  Negative for difficulty urinating.  Musculoskeletal: Negative.   Skin: Negative.  Negative for rash.  Neurological:  Negative for dizziness, weakness and numbness.  Hematological:  Positive for adenopathy. Does not bruise/bleed easily.  Psychiatric/Behavioral: Negative.      Objective:  BP 118/70 (BP Location: Left Arm, Patient Position: Sitting, Cuff Size: Large)   Pulse 70   Temp 98.3 F (36.8 C) (Oral)   Resp 16   Ht 5\' 8"  (1.727 m)   Wt 167 lb (75.8 kg)   LMP   (LMP Unknown)   SpO2 95%   BMI 25.39 kg/m   BP Readings from Last 3 Encounters:  01/13/22 118/70  12/10/21 136/86  10/04/21 (!) 147/101    Wt Readings from Last 3 Encounters:  01/13/22 167 lb (75.8 kg)  12/10/21 169 lb (76.7 kg)  10/04/21 157 lb (71.2 kg)    Physical Exam Vitals reviewed.  HENT:     Right Ear: Hearing, tympanic membrane, ear canal and external ear normal.     Left Ear: No swelling or tenderness. A middle ear effusion is present. There is no impacted cerumen. No mastoid tenderness. Tympanic membrane is injected, erythematous and bulging. Tympanic membrane is not perforated or retracted.     Mouth/Throat:     Mouth: Mucous membranes are moist.  Eyes:     General: No scleral icterus.    Conjunctiva/sclera: Conjunctivae normal.  Cardiovascular:     Rate and Rhythm: Normal rate and regular rhythm.     Pulses: Normal pulses.     Heart sounds: No murmur heard.    No friction rub. No gallop.  Pulmonary:     Effort: Pulmonary effort is normal.     Breath sounds: No stridor. No wheezing, rhonchi or rales.  Abdominal:     General: Abdomen is flat.     Palpations: There is no mass.     Tenderness: There is no abdominal tenderness. There is no guarding.     Hernia: No hernia is  present.  Musculoskeletal:        General: Normal range of motion.     Cervical back: Neck supple.  Lymphadenopathy:     Head:     Right side of head: No submental, submandibular, preauricular, posterior auricular or occipital adenopathy.     Left side of head: No submental, submandibular, preauricular, posterior auricular or occipital adenopathy.     Cervical: Cervical adenopathy present.     Right cervical: Superficial cervical adenopathy and deep cervical adenopathy present. No posterior cervical adenopathy.    Left cervical: No superficial, deep or posterior cervical adenopathy.     Upper Body:     Right upper body: No supraclavicular or axillary adenopathy.     Left upper body: No  supraclavicular or axillary adenopathy.     Comments: There is a chain of lymph nodes on the right lateral cervical region that are small but enlarged compared to the left side.  Skin:    General: Skin is warm.     Coloration: Skin is not pale.     Findings: No lesion or rash.  Neurological:     General: No focal deficit present.     Mental Status: She is alert. Mental status is at baseline.  Psychiatric:        Mood and Affect: Mood normal.     Lab Results  Component Value Date   WBC 4.5 01/13/2022   HGB 11.8 (L) 01/13/2022   HCT 35.3 (L) 01/13/2022   PLT 375.0 01/13/2022   GLUCOSE 83 10/04/2021   CHOL 131 07/11/2020   TRIG 56 07/11/2020   HDL 58 07/11/2020   LDLCALC 64 07/11/2020   ALT 26 10/04/2021   AST 40 10/04/2021   NA 139 10/04/2021   K 4.1 10/04/2021   CL 104 10/04/2021   CREATININE 0.79 10/04/2021   BUN 11 10/04/2021   CO2 28 10/04/2021   TSH 3.18 07/11/2020    CT Abdomen Pelvis W Contrast  Result Date: 10/04/2021 CLINICAL DATA:  Epigastric pain. EXAM: CT ABDOMEN AND PELVIS WITH CONTRAST TECHNIQUE: Multidetector CT imaging of the abdomen and pelvis was performed using the standard protocol following bolus administration of intravenous contrast. RADIATION DOSE REDUCTION: This exam was performed according to the departmental dose-optimization program which includes automated exposure control, adjustment of the mA and/or kV according to patient size and/or use of iterative reconstruction technique. CONTRAST:  150mL OMNIPAQUE IOHEXOL 300 MG/ML  SOLN COMPARISON:  June 19, 2021 FINDINGS: Lower chest: No acute abnormality. Hepatobiliary: No focal liver abnormality is seen. No gallstones, gallbladder wall thickening, or biliary dilatation. Pancreas: Unremarkable. No pancreatic ductal dilatation or surrounding inflammatory changes. Spleen: Normal in size without focal abnormality. Adrenals/Urinary Tract: Adrenal glands are unremarkable. Kidneys are normal in size, without focal  lesions. An 8 mm renal calculus is seen within the distal left ureter (axial CT image 54, CT series 607). Moderate size left-sided hydronephrosis and hydroureter are also present. A moderate amount of contrast is seen within the dependent portion of an otherwise normal appearing urinary bladder. Stomach/Bowel: Multiple surgical sutures are seen within the gastric region. Surgically anastomosed bowel is also seen within the anterior aspect of the mid to lower left abdomen. The appendix is not clearly identified. No evidence of bowel wall thickening, distention, or inflammatory changes. There is twisting of the mesentery of the mid and lower abdomen, along the midline (axial CT images 33 through 51, CT series 607). This was seen on the prior study, but to a much lesser  degree (axial CT images 41 through 45, CT series 2 of the prior study). Vascular/Lymphatic: No significant vascular findings are present. No enlarged abdominal or pelvic lymph nodes. Reproductive: A predominant stable 3.9 cm x 2.9 cm exophytic posterior left-sided uterine fibroid is seen. The bilateral adnexa are unremarkable. Other: No abdominal wall hernia or abnormality. No abdominopelvic ascites. Musculoskeletal: No acute or significant osseous findings. IMPRESSION: 1. Twisting of the mesentery of the mid and lower abdomen, without evidence of bowel dilatation. While this may be transient in nature, the presence of an internal hernia cannot be excluded. 2. 8 mm left ureteral calculus with left-sided hydronephrosis and hydroureter. 3. Evidence of prior gastric bypass surgery. 4. Stable exophytic left-sided uterine fibroid. Electronically Signed   By: Aram Candela M.D.   On: 10/04/2021 21:33    Assessment & Plan:   Hae was seen today for anemia.  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  She wants to see hematology. -     CBC with Differential/Platelet;  Future -     cyanocobalamin (VITAMIN B12) injection 1,000 mcg -     CBC with Differential/Platelet  Anemia due to zinc deficiency -     CBC with Differential/Platelet; Future -     CBC with Differential/Platelet  Anemia due to acquired thiamine deficiency -     CBC with Differential/Platelet; Future -     CBC with Differential/Platelet  Non-recurrent acute serous otitis media of left ear -     amoxicillin-clavulanate (AUGMENTIN) 875-125 MG tablet; Take 1 tablet by mouth 2 (two) times daily for 7 days.  Lymphadenopathy, cervical- Other than anemia her labs are reassuring.  Will treat for bacterial lymphadenitis with Augmentin. -     HIV Antibody (routine testing w rflx); Future -     C-reactive protein; Future -     C-reactive protein -     HIV Antibody (routine testing w rflx) -     Ambulatory referral to Hematology / Oncology   I am having Sabrina Owen start on amoxicillin-clavulanate. I am also having her maintain her FLUoxetine, Multi-Vitamin, valACYclovir, zinc gluconate, fluticasone, and fluconazole. We administered cyanocobalamin.  Meds ordered this encounter  Medications   amoxicillin-clavulanate (AUGMENTIN) 875-125 MG tablet    Sig: Take 1 tablet by mouth 2 (two) times daily for 7 days.    Dispense:  14 tablet    Refill:  0   cyanocobalamin (VITAMIN B12) injection 1,000 mcg     Follow-up: Return in about 3 months (around 04/14/2022).  Sanda Linger, MD

## 2022-01-13 NOTE — Patient Instructions (Signed)
Lymphadenopathy  Lymphadenopathy means that your lymph glands are swollen or larger than normal. Lymph glands, also called lymph nodes, are collections of tissue that filter excess fluid, bacteria, viruses, and waste from your bloodstream. They are part of your body's disease-fighting system (immune system), which protects your body from germs. There may be different causes of lymphadenopathy, depending on where it is in your body. Some types go away on their own. Lymphadenopathy can occur anywhere that you have lymph glands, including these areas: Neck (cervical lymphadenopathy). Chest (mediastinal lymphadenopathy). Lungs (hilar lymphadenopathy). Underarms (axillary lymphadenopathy). Groin (inguinal lymphadenopathy). When your immune system responds to germs, infection-fighting cells and fluid build up in your lymph glands. This causes some swelling and enlargement. If the lymph nodes do not go back to normal size after you have an infection or disease, your health care provider may do tests. These tests help to monitor your condition and find the reason why the glands are still swollen and enlarged. Follow these instructions at home:  Get plenty of rest. Your health care provider may recommend over-the-counter medicines for pain. Take over-the-counter and prescription medicines only as told by your health care provider. If directed, apply heat to swollen lymph glands as often as told by your health care provider. Use the heat source that your health care provider recommends, such as a moist heat pack or a heating pad. Place a towel between your skin and the heat source. Leave the heat on for 20-30 minutes. Remove the heat if your skin turns bright red. This is especially important if you are unable to feel pain, heat, or cold. You may have a greater risk of getting burned. Check your affected lymph glands every day for changes. Check other lymph gland areas as told by your health care provider.  Check for changes such as: More swelling. Sudden increase in size. Redness or pain. Hardness. Keep all follow-up visits. This is important. Contact a health care provider if you have: Lymph glands that: Are still swollen after 2 weeks. Have suddenly gotten bigger or the swelling spreads. Are red, painful, or hard. Fluid leaking from the skin near an enlarged lymph gland. Problems with breathing. A fever, chills, or night sweats. Fatigue. A sore throat. Pain in your abdomen. Weight loss. Get help right away if you have: Severe pain. Chest pain. Shortness of breath. These symptoms may represent a serious problem that is an emergency. Do not wait to see if the symptoms will go away. Get medical help right away. Call your local emergency services (911 in the [place] Do not drive yourself to the hospital. Summary Lymphadenopathy means that your lymph glands are swollen or larger than normal. Lymph glands, also called lymph nodes, are collections of tissue that filter excess fluid, bacteria, viruses, and waste from the bloodstream. They are part of your body's disease-fighting system (immune system). Lymphadenopathy can occur anywhere that you have lymph glands. If the lymph nodes do not go back to normal size after you have an infection or disease, your health care provider may do tests to monitor your condition and find the reason why the glands are still swollen and enlarged. Check your affected lymph glands every day for changes. Check other lymph gland areas as told by your health care provider. This information is not intended to replace advice given to you by your health care provider. Make sure you discuss any questions you have with your health care provider. Document Revised: 10/19/2019 Document Reviewed: 10/19/2019 Elsevier Patient Education    2023 [employer] Inc[employer]  

## 2022-01-14 ENCOUNTER — Encounter: Payer: Self-pay | Admitting: Internal Medicine

## 2022-01-14 LAB — HIV ANTIBODY (ROUTINE TESTING W REFLEX): HIV 1&2 Ab, 4th Generation: NONREACTIVE

## 2022-01-15 ENCOUNTER — Other Ambulatory Visit: Payer: Self-pay | Admitting: Internal Medicine

## 2022-01-15 DIAGNOSIS — D649 Anemia, unspecified: Secondary | ICD-10-CM

## 2022-01-15 DIAGNOSIS — R590 Localized enlarged lymph nodes: Secondary | ICD-10-CM

## 2022-02-13 ENCOUNTER — Ambulatory Visit (INDEPENDENT_AMBULATORY_CARE_PROVIDER_SITE_OTHER): Payer: 59

## 2022-02-13 DIAGNOSIS — E538 Deficiency of other specified B group vitamins: Secondary | ICD-10-CM | POA: Diagnosis not present

## 2022-02-13 MED ORDER — CYANOCOBALAMIN 1000 MCG/ML IJ SOLN
1000.0000 ug | Freq: Once | INTRAMUSCULAR | Status: AC
  Administered 2022-02-13: 1000 ug via INTRAMUSCULAR

## 2022-02-13 NOTE — Progress Notes (Addendum)
After obtaining consent, and per orders of Dr. Ronnald Ramp, injection of B12 given by Marrian Salvage. Patient instructed to report any adverse reaction to me immediately.

## 2022-02-15 ENCOUNTER — Encounter: Payer: Self-pay | Admitting: Internal Medicine

## 2022-02-17 ENCOUNTER — Other Ambulatory Visit: Payer: Self-pay | Admitting: Internal Medicine

## 2022-02-17 DIAGNOSIS — U071 COVID-19: Secondary | ICD-10-CM | POA: Insufficient documentation

## 2022-02-17 MED ORDER — NIRMATRELVIR/RITONAVIR (PAXLOVID)TABLET: 3.0000 | ORAL_TABLET | Freq: Two times a day (BID) | ORAL | 0 refills | Status: AC

## 2022-02-28 ENCOUNTER — Other Ambulatory Visit: Payer: Self-pay | Admitting: Acute Care

## 2022-02-28 DIAGNOSIS — G35 Multiple sclerosis: Secondary | ICD-10-CM

## 2022-03-22 ENCOUNTER — Ambulatory Visit
Admission: RE | Admit: 2022-03-22 | Discharge: 2022-03-22 | Disposition: A | Discharge status: Home / Self Care | Payer: 59 | Source: Ambulatory Visit | Attending: Acute Care | Admitting: Acute Care

## 2022-03-22 DIAGNOSIS — G35 Multiple sclerosis: Secondary | ICD-10-CM

## 2022-03-22 MED ORDER — GADOPICLENOL 0.5 MMOL/ML IV SOLN
7.0000 mL | Freq: Once | INTRAVENOUS | Status: AC | PRN
  Administered 2022-03-22: 7 mL via INTRAVENOUS

## 2022-04-15 ENCOUNTER — Ambulatory Visit: Payer: 59 | Admitting: Internal Medicine

## 2022-05-18 ENCOUNTER — Encounter: Payer: Self-pay | Admitting: Internal Medicine

## 2022-06-09 LAB — BASIC METABOLIC PANEL
BUN: 15 (ref 4–21)
CO2: 24 — AB (ref 13–22)
Chloride: 106 (ref 99–108)
Creatinine: 0.8 (ref 0.5–1.1)
Glucose: 81
Potassium: 4.4 mEq/L (ref 3.5–5.1)
Sodium: 139 (ref 137–147)

## 2022-06-09 LAB — CBC AND DIFFERENTIAL
HCT: 36 (ref 36–46)
Hemoglobin: 11.9 — AB (ref 12.0–16.0)
Platelets: 327 10*3/uL (ref 150–400)
WBC: 5.2

## 2022-06-09 LAB — HEPATIC FUNCTION PANEL
ALT: 31 U/L (ref 7–35)
AST: 37 — AB (ref 13–35)
Alkaline Phosphatase: 57 (ref 25–125)
Bilirubin, Total: 0.4

## 2022-06-09 LAB — COMPREHENSIVE METABOLIC PANEL
Albumin: 3.9 (ref 3.5–5.0)
Calcium: 8.9 (ref 8.7–10.7)

## 2022-06-10 ENCOUNTER — Encounter: Payer: Self-pay | Admitting: Internal Medicine

## 2022-06-12 ENCOUNTER — Ambulatory Visit: Payer: 59 | Admitting: Internal Medicine

## 2022-06-12 ENCOUNTER — Encounter: Payer: Self-pay | Admitting: Internal Medicine

## 2022-06-12 ENCOUNTER — Other Ambulatory Visit: Payer: Self-pay | Admitting: Internal Medicine

## 2022-06-12 VITALS — BP 122/76 | HR 65 | Temp 98.0°F | Resp 16 | Ht 68.0 in | Wt 165.0 lb

## 2022-06-12 DIAGNOSIS — R55 Syncope and collapse: Secondary | ICD-10-CM | POA: Diagnosis not present

## 2022-06-12 DIAGNOSIS — D539 Nutritional anemia, unspecified: Secondary | ICD-10-CM | POA: Diagnosis not present

## 2022-06-12 DIAGNOSIS — Z1211 Encounter for screening for malignant neoplasm of colon: Secondary | ICD-10-CM

## 2022-06-12 DIAGNOSIS — Z23 Encounter for immunization: Secondary | ICD-10-CM | POA: Diagnosis not present

## 2022-06-12 DIAGNOSIS — Z124 Encounter for screening for malignant neoplasm of cervix: Secondary | ICD-10-CM

## 2022-06-12 DIAGNOSIS — Z0001 Encounter for general adult medical examination with abnormal findings: Secondary | ICD-10-CM

## 2022-06-12 DIAGNOSIS — R002 Palpitations: Secondary | ICD-10-CM

## 2022-06-12 DIAGNOSIS — D538 Other specified nutritional anemias: Secondary | ICD-10-CM

## 2022-06-12 DIAGNOSIS — D508 Other iron deficiency anemias: Secondary | ICD-10-CM

## 2022-06-12 DIAGNOSIS — D511 Vitamin B12 deficiency anemia due to selective vitamin B12 malabsorption with proteinuria: Secondary | ICD-10-CM

## 2022-06-12 DIAGNOSIS — E519 Thiamine deficiency, unspecified: Secondary | ICD-10-CM

## 2022-06-12 LAB — IBC + FERRITIN
Ferritin: 6.5 ng/mL — ABNORMAL LOW (ref 10.0–291.0)
Iron: 67 ug/dL (ref 42–145)
Saturation Ratios: 17.3 % — ABNORMAL LOW (ref 20.0–50.0)
TIBC: 387.8 ug/dL (ref 250.0–450.0)
Transferrin: 277 mg/dL (ref 212.0–360.0)

## 2022-06-12 LAB — TSH: TSH: 2.56 u[IU]/mL (ref 0.35–5.50)

## 2022-06-12 LAB — FOLATE: Folate: 13.7 ng/mL (ref >5.9)

## 2022-06-12 MED ORDER — CYANOCOBALAMIN 1000 MCG/ML IJ SOLN
1000.0000 ug | Freq: Once | INTRAMUSCULAR | Status: AC
  Administered 2022-06-12: 1000 ug via INTRAMUSCULAR

## 2022-06-12 MED ORDER — ACCRUFER 30 MG PO CAPS: 1.0000 | ORAL_CAPSULE | Freq: Two times a day (BID) | ORAL | 0 refills | Status: DC

## 2022-06-12 MED ORDER — FERROUS SULFATE 325 (65 FE) MG PO TABS: 325.0000 mg | ORAL_TABLET | Freq: Every day | ORAL | 1 refills | Status: DC

## 2022-06-12 NOTE — Progress Notes (Signed)
Subjective:  Patient ID: Sabrina Owen, female    DOB: [birth year]  Age: 47 y.o. MRN: [id number]  CC: Anemia and Annual Exam   HPI Sabrina Owen presents for a CPX and f/up ---   Sometime in the last 1-2 months she was on a cruise and had a syncopal episode. She has been seen in the ED and by her MS doctor and has been told that the MS is worsening and her meds have been changed.  Outpatient Medications Prior to Visit  Medication Sig Dispense Refill   fluconazole (DIFLUCAN) 150 MG tablet Take 1 tablet (150 mg total) by mouth daily. 1 tablet 3   FLUoxetine (PROZAC) 20 MG capsule Take 3 capsules by mouth daily.     Multiple Vitamin (MULTI-VITAMIN) tablet Take 1 tablet by mouth daily.     valACYclovir (VALTREX) 500 MG tablet Take 1 tablet by mouth daily.     fluticasone (FLONASE) 50 MCG/ACT nasal spray Place 2 sprays into both nostrils daily. 16 g 0   zinc gluconate 50 MG tablet Take 1 tablet (50 mg total) by mouth daily. 90 tablet 1   No facility-administered medications prior to visit.    ROS Review of Systems  Constitutional:  Positive for fatigue. Negative for appetite change, diaphoresis and unexpected weight change.  HENT: Negative.  Negative for sore throat and trouble swallowing.   Eyes: Negative.   Respiratory:  Negative for cough, chest tightness, shortness of breath and wheezing.   Cardiovascular:  Positive for palpitations. Negative for chest pain and leg swelling.  Gastrointestinal:  Negative for abdominal pain, diarrhea, nausea and vomiting.  Endocrine: Negative.   Genitourinary: Negative.  Negative for difficulty urinating.  Musculoskeletal: Negative.   Skin: Negative.   Neurological:  Positive for dizziness, syncope, facial asymmetry, speech difficulty, weakness, light-headedness and numbness.  Hematological:  Negative for adenopathy. Does not bruise/bleed easily.  Psychiatric/Behavioral: Negative.      Objective:  BP 122/76 (BP Location: Left Arm, Patient  Position: Sitting, Cuff Size: Large)   Pulse 65   Temp 98 F (36.7 C) (Oral)   Resp 16   Ht 5\' 8"  (1.727 m)   Wt 165 lb (74.8 kg)   SpO2 98%   BMI 25.09 kg/m   BP Readings from Last 3 Encounters:  06/12/22 122/76  01/13/22 118/70  12/10/21 136/86    Wt Readings from Last 3 Encounters:  06/12/22 165 lb (74.8 kg)  01/13/22 167 lb (75.8 kg)  12/10/21 169 lb (76.7 kg)    Physical Exam Vitals reviewed.  Constitutional:      General: She is not in acute distress.    Appearance: She is ill-appearing. She is not toxic-appearing or diaphoretic.  HENT:     Nose: Nose normal.     Mouth/Throat:     Mouth: Mucous membranes are moist.  Eyes:     General: No scleral icterus.    Conjunctiva/sclera: Conjunctivae normal.  Cardiovascular:     Rate and Rhythm: Normal rate and regular rhythm.     Heart sounds: Normal heart sounds, S1 normal and S2 normal. No murmur heard.    Comments: EKG- NSR, 63 bpm LAE No Q waves, or ST/T wave changes Pulmonary:     Effort: Pulmonary effort is normal.     Breath sounds: No stridor. No wheezing, rhonchi or rales.  Abdominal:     General: Abdomen is flat.     Tenderness: There is no abdominal tenderness. There is no guarding or rebound.  Hernia: No hernia is present.  Musculoskeletal:     Cervical back: Neck supple.     Right lower leg: No edema.     Left lower leg: No edema.  Lymphadenopathy:     Cervical: No cervical adenopathy.  Skin:    General: Skin is warm and dry.     Coloration: Skin is not pale.  Neurological:     Mental Status: She is alert. Mental status is at baseline.  Psychiatric:        Mood and Affect: Mood normal.        Behavior: Behavior normal.     Lab Results  Component Value Date   WBC 5.2 06/09/2022   HGB 11.9 (A) 06/09/2022   HCT 36 06/09/2022   PLT 327 06/09/2022   GLUCOSE 83 10/04/2021   CHOL 131 07/11/2020   TRIG 56 07/11/2020   HDL 58 07/11/2020   LDLCALC 64 07/11/2020   ALT 31 06/09/2022   AST  37 (A) 06/09/2022   NA 139 06/09/2022   K 4.4 06/09/2022   CL 106 06/09/2022   CREATININE 0.8 06/09/2022   BUN 15 06/09/2022   CO2 24 (A) 06/09/2022   TSH 2.56 06/12/2022    MR BRAIN W WO CONTRAST  Result Date: 03/26/2022 CLINICAL DATA:  Follow-up multiple sclerosis EXAM: MRI HEAD WITHOUT AND WITH CONTRAST TECHNIQUE: Multiplanar, multiecho pulse sequences of the brain and surrounding structures were obtained without and with intravenous contrast. CONTRAST:  7 cc Vueway COMPARISON:  None Available. 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 Mild susceptibility artifact in this location consistent with minimal hemosiderin deposition. No evidence of posterior fossa lesion suggestive of demyelinating disease. Cerebral hemispheres show numerous foci of abnormal T2 and FLAIR signal within the deep more than subcortical white matter of both hemispheres, many showing a typical perpendicular orientation to the lateral ventricles, consistent with the clinical diagnosis of multiple sclerosis. No lesions show restricted diffusion or contrast enhancement. No mass, hydrocephalus or extra-axial collection. Vascular: Major vessels at the base of the brain show flow. Skull and upper cervical spine: Negative Sinuses/Orbits: Clear except for an insignificant retention cyst at the right maxillary sinus floor. Orbits negative. Other: None IMPRESSION: 1. Numerous foci of abnormal T2 and FLAIR signal within the deep more than subcortical white matter of both cerebral hemispheres, many showing a typical perpendicular orientation to the lateral ventricles, consistent with the clinical diagnosis of multiple sclerosis. No lesions show restricted diffusion or contrast enhancement. 2. 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 This should not be  of clinical relevance. Electronically Signed   By: Paulina Fusi M.D.   On: 03/26/2022 15:31    Assessment & Plan:   Anemia due to acquired thiamine deficiency -     Vitamin B1; Future  Anemia due to zinc deficiency -     Zinc; Future -     Zinc Gluconate; Take 1 tablet (50 mg total) by mouth daily.  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 Will evaluate for dysrhythmia. -     EKG 12-Lead -     CARDIAC EVENT MONITOR; Future -     TSH; Future  Syncope and collapse -     CARDIAC EVENT MONITOR; Future  Deficiency anemia -     IBC + Ferritin; Future -     Vitamin B1; Future -     Zinc; Future -     Reticulocytes; Future -     Folate; Future  Screen for colon cancer -     Ambulatory referral to Gastroenterology  Cervical cancer screening -     Ambulatory referral to Gynecology  Encounter for general adult medical examination with abnormal findings- Exam completed, labs reviewed, vaccines reviewed and updated, cancer screenings addressed, pt ed material was given.  Iron deficiency anemia secondary to inadequate dietary iron intake -     Ferrous Sulfate; Take 1 tablet (325 mg total) by mouth daily with breakfast.  Dispense: 90 tablet; Refill: 1  Other orders -     Tdap vaccine greater than or equal to 7yo IM     Follow-up: Return in about 3 months (around 09/12/2022).  Sanda Linger, MD

## 2022-06-12 NOTE — Patient Instructions (Signed)

## 2022-06-17 DIAGNOSIS — R55 Syncope and collapse: Secondary | ICD-10-CM

## 2022-06-17 DIAGNOSIS — R002 Palpitations: Secondary | ICD-10-CM

## 2022-06-17 LAB — RETICULOCYTES
ABS Retic: 41100 cells/uL (ref 20000–80000)
Retic Ct Pct: 1 %

## 2022-06-17 LAB — VITAMIN B1: Vitamin B1 (Thiamine): 16 nmol/L (ref 8–30)

## 2022-06-17 LAB — ZINC: Zinc: 58 ug/dL — ABNORMAL LOW (ref 60–130)

## 2022-06-17 MED ORDER — ZINC GLUCONATE 50 MG PO TABS: 50.0000 mg | ORAL_TABLET | Freq: Every day | ORAL | 1 refills | Status: DC

## 2022-06-19 ENCOUNTER — Encounter: Payer: Self-pay | Admitting: Internal Medicine

## 2022-07-03 ENCOUNTER — Encounter: Payer: Self-pay | Admitting: Internal Medicine

## 2022-07-15 ENCOUNTER — Other Ambulatory Visit: Payer: Self-pay | Admitting: Internal Medicine

## 2022-07-15 DIAGNOSIS — R002 Palpitations: Secondary | ICD-10-CM

## 2022-07-30 ENCOUNTER — Ambulatory Visit: Payer: 59 | Attending: Internal Medicine

## 2022-07-30 DIAGNOSIS — R55 Syncope and collapse: Secondary | ICD-10-CM

## 2022-07-30 DIAGNOSIS — R002 Palpitations: Secondary | ICD-10-CM

## 2022-08-21 ENCOUNTER — Encounter (INDEPENDENT_AMBULATORY_CARE_PROVIDER_SITE_OTHER): Payer: Self-pay

## 2022-09-15 ENCOUNTER — Ambulatory Visit: Payer: 59 | Admitting: Internal Medicine

## 2022-09-15 ENCOUNTER — Encounter: Payer: Self-pay | Admitting: Cardiovascular Disease

## 2022-09-15 ENCOUNTER — Encounter: Payer: Self-pay | Admitting: Internal Medicine

## 2022-09-15 ENCOUNTER — Ambulatory Visit: Payer: 59 | Attending: Cardiovascular Disease | Admitting: Cardiovascular Disease

## 2022-09-15 VITALS — BP 124/78 | HR 59 | Ht 68.5 in | Wt 165.4 lb

## 2022-09-15 VITALS — BP 122/76 | HR 82 | Temp 98.2°F | Resp 16 | Ht 68.0 in | Wt 167.0 lb

## 2022-09-15 DIAGNOSIS — Z1211 Encounter for screening for malignant neoplasm of colon: Secondary | ICD-10-CM

## 2022-09-15 DIAGNOSIS — G35 Multiple sclerosis: Secondary | ICD-10-CM

## 2022-09-15 DIAGNOSIS — Z1231 Encounter for screening mammogram for malignant neoplasm of breast: Secondary | ICD-10-CM

## 2022-09-15 DIAGNOSIS — D511 Vitamin B12 deficiency anemia due to selective vitamin B12 malabsorption with proteinuria: Secondary | ICD-10-CM | POA: Diagnosis not present

## 2022-09-15 DIAGNOSIS — R55 Syncope and collapse: Secondary | ICD-10-CM

## 2022-09-15 DIAGNOSIS — R002 Palpitations: Secondary | ICD-10-CM | POA: Diagnosis not present

## 2022-09-15 DIAGNOSIS — H209 Unspecified iridocyclitis: Secondary | ICD-10-CM

## 2022-09-15 DIAGNOSIS — M329 Systemic lupus erythematosus, unspecified: Secondary | ICD-10-CM

## 2022-09-15 DIAGNOSIS — Z23 Encounter for immunization: Secondary | ICD-10-CM

## 2022-09-15 DIAGNOSIS — Z124 Encounter for screening for malignant neoplasm of cervix: Secondary | ICD-10-CM

## 2022-09-15 DIAGNOSIS — D508 Other iron deficiency anemias: Secondary | ICD-10-CM

## 2022-09-15 MED ORDER — CYANOCOBALAMIN 1000 MCG/ML IJ SOLN
1000.0000 ug | Freq: Once | INTRAMUSCULAR | Status: AC
  Administered 2022-09-15: 1000 ug via INTRAMUSCULAR

## 2022-09-15 NOTE — Patient Instructions (Signed)
Medication Instructions:  No changes *If you need a refill on your cardiac medications before your next appointment, please call your pharmacy*   Testing/Procedures: Your physician has requested that you have an echocardiogram. Echocardiography is a painless test that uses sound waves to create images of your heart. It provides your doctor with information about the size and shape of your heart and how well your heart's chambers and valves are working. This procedure takes approximately one hour. There are no restrictions for this procedure. Please do NOT wear cologne, perfume, aftershave, or lotions (deodorant is allowed). Please arrive 15 minutes prior to your appointment time.    Follow-Up: At [facility], you and your health needs are our priority.  As part of our continuing mission to provide you with exceptional heart care, we have created designated Provider Care Teams.  These Care Teams include your primary Cardiologist (physician) and Advanced Practice Providers (APPs -  Physician Assistants and Nurse Practitioners) who all work together to provide you with the care you need, when you need it.  We recommend signing up for the patient portal called "MyChart".  Sign up information is provided on this After Visit Summary.  MyChart is used to connect with patients for Virtual Visits (Telemedicine).  Patients are able to view lab/test results, encounter notes, upcoming appointments, etc.  Non-urgent messages can be sent to your provider as well.   To learn more about what you can do with MyChart, go to [email or web address].    Your next appointment:    Follow up as needed  Provider:   Dr Croitoru  

## 2022-09-15 NOTE — Progress Notes (Unsigned)
Subjective:  Patient ID: Sabrina Owen, female    DOB: 04/23/1975  Age: 47 y.o. MRN: [id number]  CC: Anemia   HPI Sabrina Owen presents for f/up ----  Discussed the use of AI scribe software for clinical note transcription with the patient, who gave verbal consent to proceed.  History of Present Illness   The patient, with a history of palpitations, reports continued intermittent episodes of palpitations that occur randomly. She denies any recent episodes of syncope. She also reports numbness in her extremities and is currently on Kesimpta, which she is considering discontinuing due to adverse effects. Since starting Kesimpta, she has experienced worsening vision and a flare of inflammation in her eye, requiring an implant. She is currently using Pred Forte drops every two hours and Atropine drops twice a day in an attempt to restore her vision.  The patient also reports difficulty scheduling a Pap smear and mammogram, with the last mammogram being at least three years ago. She denies any current breast lumps, nipple discharge, or swollen lymph nodes in the armpit. She also reports normal bowel movements and no swollen lymph nodes elsewhere.  The patient works as a Copywriter, advertising for [employer], reviewing claims and spreadsheets. She reports difficulty with this task due to her vision issues. She also reports receiving a B12 shot every one to two months.       Outpatient Medications Prior to Visit  Medication Sig Dispense Refill   ferrous sulfate 325 (65 FE) MG tablet Take 1 tablet (325 mg total) by mouth daily with breakfast. 90 tablet 1   fluconazole (DIFLUCAN) 150 MG tablet Take 1 tablet (150 mg total) by mouth daily. 1 tablet 3   FLUoxetine (PROZAC) 20 MG capsule Take 3 capsules by mouth daily.     Multiple Vitamin (MULTI-VITAMIN) tablet Take 1 tablet by mouth daily.     valACYclovir (VALTREX) 500 MG tablet Take 1 tablet by mouth daily. (Patient not taking: Reported on 09/15/2022)      zinc gluconate 50 MG tablet Take 1 tablet (50 mg total) by mouth daily. 90 tablet 1   No facility-administered medications prior to visit.    ROS Review of Systems  Constitutional:  Negative for appetite change, chills, diaphoresis and fatigue.  HENT: Negative.    Eyes:  Positive for visual disturbance.  Respiratory:  Negative for cough, chest tightness, shortness of breath and wheezing.   Cardiovascular:  Negative for chest pain, palpitations and leg swelling.  Gastrointestinal: Negative.  Negative for abdominal pain, constipation, diarrhea, nausea and vomiting.  Genitourinary:  Negative for difficulty urinating.  Musculoskeletal: Negative.   Skin: Negative.   Neurological:  Negative for dizziness, weakness and light-headedness.  Hematological:  Negative for adenopathy. Does not bruise/bleed easily.  Psychiatric/Behavioral: Negative.      Objective:  BP 122/76 (BP Location: Left Arm, Patient Position: Sitting, Cuff Size: Large)   Pulse 82   Temp 98.2 F (36.8 C) (Oral)   Resp 16   Ht 5\' 8"  (1.727 m)   Wt 167 lb (75.8 kg)   LMP  (Exact Date)   SpO2 99%   BMI 25.39 kg/m   BP Readings from Last 3 Encounters:  09/15/22 124/78  09/15/22 122/76  06/12/22 122/76    Wt Readings from Last 3 Encounters:  09/15/22 165 lb 6.4 oz (75 kg)  09/15/22 167 lb (75.8 kg)  06/12/22 165 lb (74.8 kg)    Physical Exam Vitals reviewed.  Constitutional:      Appearance: Normal appearance.  HENT:     Mouth/Throat:     Mouth: Mucous membranes are moist.  Eyes:     General: No scleral icterus.    Conjunctiva/sclera: Conjunctivae normal.  Cardiovascular:     Rate and Rhythm: Normal rate and regular rhythm.     Heart sounds: No murmur heard.    No gallop.  Pulmonary:     Effort: Pulmonary effort is normal.     Breath sounds: No stridor. No wheezing, rhonchi or rales.  Abdominal:     General: Abdomen is flat.     Palpations: There is no mass.     Tenderness: There is no abdominal  tenderness. There is no guarding.     Hernia: No hernia is present.  Musculoskeletal:        General: Normal range of motion.     Cervical back: Neck supple.     Right lower leg: No edema.     Left lower leg: No edema.  Lymphadenopathy:     Cervical: No cervical adenopathy.  Skin:    General: Skin is warm and dry.  Neurological:     General: No focal deficit present.     Mental Status: She is alert. Mental status is at baseline.  Psychiatric:        Mood and Affect: Mood normal.        Behavior: Behavior normal.     Lab Results  Component Value Date   WBC 5.2 06/09/2022   HGB 11.9 (A) 06/09/2022   HCT 36 06/09/2022   PLT 327 06/09/2022   GLUCOSE 83 10/04/2021   CHOL 131 07/11/2020   TRIG 56 07/11/2020   HDL 58 07/11/2020   LDLCALC 64 07/11/2020   ALT 31 06/09/2022   AST 37 (A) 06/09/2022   NA 139 06/09/2022   K 4.4 06/09/2022   CL 106 06/09/2022   CREATININE 0.8 06/09/2022   BUN 15 06/09/2022   CO2 24 (A) 06/09/2022   TSH 2.56 06/12/2022    MR BRAIN W WO CONTRAST  Result Date: 03/26/2022 CLINICAL DATA:  Follow-up multiple sclerosis EXAM: MRI HEAD WITHOUT AND WITH CONTRAST TECHNIQUE: Multiplanar, multiecho pulse sequences of the brain and surrounding structures were obtained without and with intravenous contrast. CONTRAST:  7 cc Vueway COMPARISON:  None Available. 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 Mild susceptibility artifact in this location consistent with minimal hemosiderin deposition. No evidence of posterior fossa lesion suggestive of demyelinating disease. Cerebral hemispheres show numerous foci of abnormal T2 and FLAIR signal within the deep more than subcortical white matter of both hemispheres, many showing a typical perpendicular orientation to the lateral ventricles, consistent with the clinical diagnosis of multiple sclerosis. No lesions show  restricted diffusion or contrast enhancement. No mass, hydrocephalus or extra-axial collection. Vascular: Major vessels at the base of the brain show flow. Skull and upper cervical spine: Negative Sinuses/Orbits: Clear except for an insignificant retention cyst at the right maxillary sinus floor. Orbits negative. Other: None IMPRESSION: 1. Numerous foci of abnormal T2 and FLAIR signal within the deep more than subcortical white matter of both cerebral hemispheres, many showing a typical perpendicular orientation to the lateral ventricles, consistent with the clinical diagnosis of multiple sclerosis. No lesions show restricted diffusion or contrast enhancement. 2. 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 This should not be of clinical relevance. Electronically Signed  By: Paulina Fusi M.D.   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 -     IBC + Ferritin; Future -     CBC with Differential/Platelet; Future     Follow-up: Return in about 6 months (around 03/15/2023).  Sanda Linger, MD

## 2022-09-15 NOTE — Patient Instructions (Signed)

## 2022-09-15 NOTE — Progress Notes (Signed)
Cardiology Office Note:    Date:  09/15/2022   ID:  Sabrina Owen, DOB Sep 07, 1975, MRN [id number]  PCP:  [name], MD   [facility] Providers Cardiologist:  None     Referring MD: [name], MD   Chief Complaint  Patient presents with   Consult  Sabrina Owen is a 47 y.o. female who is being seen today for the evaluation of syncope at the request of [name], MD.   History of Present Illness:    Sabrina Owen is a 47 y.o. female with a hx of lupus (unclear if systemic lupus erythematosus versus cutaneous lupus), relapsing multiple sclerosis, left eye uveitis, remote gastric bypass for obesity, recurrent iron deficiency anemia, referred in consultation by Dr. Sanda Linger for an episode of syncope.  05/17/2022 she was in the farmers market on a hot day, standing in line with her mother when she felt hot, weak and dizzy.  She had time to tell her mother that "something is not right".  She felt "spaced out".  She subsequently lost consciousness and woke up laying on the ground with other bystanders leaning over her.  It took her several minutes to feel back to normal.  She did not injure herself.  Apparently a couple of bystanders helped lower her to the ground as she was losing consciousness.  Has not had other syncopal events, but she reports that if she has to wait in line standing for long peers of time at the store she will sometimes start feeling unwell and has to sit down to avoid passing out.  She denies problems with exertional angina or dyspnea, orthopnea, PND, lower extremity edema, palpitations, seizures, recent focal neurological events, claudication.  She reports that she as virtually no vision currently in her left eye.  She has had problem with autoimmune disease for at least 20 years.  Initially diagnosed with systemic lupus erythematosus, she has had intermittent butterfly rash and other skin changes.  Subsequently she developed neurological  symptoms that were strongly suggestive of multiple sclerosis.  She only had a seizure once during an episode of "stroke" which was subsequently identified to be multiple sclerosis.  More recently she has had recurrent problems with left eye uveitis that has been fairly refractory to therapy.  MRI of the brain performed 03/22/2022 showed numerous foci of abnormal T2 and flair signal within the deep white matter of both cortical hemispheres many showing a typical perpendicular orientation to the lateral ventricles consistent with the clinical diagnosis of multiple sclerosis.  CT of the abdomen and pelvis for kidney stones has not shown evidence of resume or atherosclerotic changes.  Past Medical History:  Diagnosis Date   Lupus (HCC)    MS (multiple sclerosis) (HCC)     Past Surgical History:  Procedure Laterality Date   CATARACT EXTRACTION     GASTRIC BYPASS     TUBAL LIGATION      Current Medications: Current Meds  Medication Sig   ferrous sulfate 325 (65 FE) MG tablet Take 1 tablet (325 mg total) by mouth daily with breakfast.   fluconazole (DIFLUCAN) 150 MG tablet Take 1 tablet (150 mg total) by mouth daily.   FLUoxetine (PROZAC) 20 MG capsule Take 3 capsules by mouth daily.   Multiple Vitamin (MULTI-VITAMIN) tablet Take 1 tablet by mouth daily.   zinc gluconate 50 MG tablet Take 1 tablet (50 mg total) by mouth daily.     Allergies:   Topamax [topiramate]  Social History   Socioeconomic History   Marital status: Single    Spouse name: Not on file   Number of children: Not on file   Years of education: Not on file   Highest education level: Bachelor's degree (e.g., BA, AB, BS)  Occupational History   Not on file  Tobacco Use   Smoking status: Never   Smokeless tobacco: Never  Substance and Sexual Activity   Alcohol use: Yes    Alcohol/week: 2.0 standard drinks of alcohol    Types: 2 Glasses of wine per week    Comment: occ   Drug use: Never   Sexual activity: Yes     Partners: Male  Other Topics Concern   Not on file  Social History Narrative   Not on file   Social Determinants of Health   Financial Resource Strain: Medium Risk (06/10/2022)   Overall Financial Resource Strain (CARDIA)    Difficulty of Paying Living Expenses: Somewhat hard  Food Insecurity: No Food Insecurity (06/10/2022)   Hunger Vital Sign    Worried About Running Out of Food in the Last Year: Never true    Ran Out of Food in the Last Year: Never true  Transportation Needs: No Transportation Needs (06/10/2022)   PRAPARE - Administrator, Civil Service (Medical): No    Lack of Transportation (Non-Medical): No  Physical Activity: Unknown (06/10/2022)   Exercise Vital Sign    Days of Exercise per Week: 0 days    Minutes of Exercise per Session: Not on file  Stress: Stress Concern Present (06/10/2022)   [employer] of Occupational Health - Occupational Stress Questionnaire    Feeling of Stress : To some extent  Social Connections: Moderately Integrated (06/10/2022)   Social Connection and Isolation Panel [NHANES]    Frequency of Communication with Friends and Family: More than three times a week    Frequency of Social Gatherings with Friends and Family: More than three times a week    Attends Religious Services: 1 to 4 times per year    Active Member of [employer] or Organizations: No    Attends Engineer, structural: Not on file    Marital Status: Married     Family History: The patient's family history includes Depression in her mother; Hypertension in her mother.  ROS:   Please see the history of present illness.     All other systems reviewed and are negative.  EKGs/Labs/Other Studies Reviewed:   ECG performed today shows sinus bradycardia and is a completely normal tracing otherwise  Event monitor July 2024 was normal  Echocardiogram 08/23/2019 ([facility]) was a normal study: The left ventricular size is normal.  LV ejection fraction = 60-65%.   Left ventricular systolic function is normal.  The right ventricle is normal in size and function.  There was insufficient TR detected to calculate RV systolic pressure.  Estimated right atrial pressure is 5 mmHg.Marland Kitchen  There is no significant valvular stenosis or regurgitation.  There is no pericardial effusion.  There is no comparison study available.   PFTs 2013 ([facility]) were normal  The following studies were reviewed today: CT abdomen and pelvis studies from 2022 and 2023, MRI of the brain from March 2024 EKG Interpretation Date/Time:  Monday September 15 2022 16:15:36 EDT Ventricular Rate:  59 PR Interval:  124 QRS Duration:  74 QT Interval:  402 QTC Calculation: 397 R Axis:   42  Text Interpretation: Sinus bradycardia No previous ECGs available Confirmed  by Thurmon Fair [phone]) on 09/15/2022 4:29:14 PM    Recent Labs: 06/09/2022: ALT 31; BUN 15; Creatinine 0.8; Hemoglobin 11.9; Platelets 327; Potassium 4.4; Sodium 139 06/12/2022: TSH 2.56  Recent Lipid Panel    Component Value Date/Time   CHOL 131 07/11/2020 0000   TRIG 56 07/11/2020 0000   HDL 58 07/11/2020 0000   LDLCALC 64 07/11/2020 0000     Risk Assessment/Calculations:                Physical Exam:    VS:  BP 124/78 (BP Location: Left Arm, Patient Position: Sitting, Cuff Size: Normal)   Pulse (!) 59   Ht 5' 8.5" (1.74 m)   Wt 165 lb 6.4 oz (75 kg)   SpO2 98%   BMI 24.78 kg/m     Wt Readings from Last 3 Encounters:  09/15/22 165 lb 6.4 oz (75 kg)  09/15/22 167 lb (75.8 kg)  06/12/22 165 lb (74.8 kg)     GEN:  Well nourished, well developed in no acute distress HEENT: Normal NECK: No JVD; No carotid bruits LYMPHATICS: No lymphadenopathy CARDIAC: RRR, no murmurs, rubs, gallops RESPIRATORY:  Clear to auscultation without rales, wheezing or rhonchi  ABDOMEN: Soft, non-tender, non-distended MUSCULOSKELETAL:  No edema; No deformity  SKIN: Warm and dry NEUROLOGIC:  Alert and oriented x  3 PSYCHIATRIC:  Normal affect   ASSESSMENT:    1. Syncope and collapse   2. Palpitations   3. History of systemic lupus erythematosus (HCC)   4. Multiple sclerosis, relapsing-remitting (HCC)   5. Uveitis    PLAN:    In order of problems listed above:  Syncope: Although not typical, this was most likely neurally mediated/vasovagal syncope.  She had a significant prodrome.  There was enough time for bystanders to slowly place her down on the ground rather than abrupt onset with immediate loss of postural tone.  There was no seizure activity.  She has had other presyncopal events suggestive of vasovagal mechanism.  Suggested staying very well-hydrated and eating a diet relatively rich in sodium and chloride.  Avoid standing without moving.  Immediately lay down or crouch if she has prodromal symptoms.  Less likely that she has true arrhythmia based on the history, but with the unexplained uveitis, I think we should perform at least an echocardiographic evaluation for sarcoidosis, which could cause high-grade AV block or ventricular arrhythmia and lead to syncope. Possible lupus: Reports past butterfly rash.  No immunological assays available for review Multiple sclerosis, relapsing: On K[place]impta, currently in remission Uveitis: Together with history of rash raises possibility for sarcoidosis.  She reports having a PET scan many years ago, results are not available.           Medication Adjustments/Labs and Tests Ordered: Current medicines are reviewed at length with the patient today.  Concerns regarding medicines are outlined above.  Orders Placed This Encounter  Procedures   EKG 12-Lead   ECHOCARDIOGRAM COMPLETE   No orders of the defined types were placed in this encounter.   Patient Instructions  Medication Instructions:  No changes *If you need a refill on your cardiac medications before your next appointment, please call your pharmacy*  Testing/Procedures: Your physician  has requested that you have an echocardiogram. Echocardiography is a painless test that uses sound waves to create images of your heart. It provides your doctor with information about the size and shape of your heart and how well your heart's chambers and valves are working. This procedure takes  approximately one hour. There are no restrictions for this procedure. Please do NOT wear cologne, perfume, aftershave, or lotions (deodorant is allowed). Please arrive 15 minutes prior to your appointment time.    Follow-Up: At [facility], you and your health needs are our priority.  As part of our continuing mission to provide you with exceptional heart care, we have created designated Provider Care Teams.  These Care Teams include your primary Cardiologist (physician) and Advanced Practice Providers (APPs -  Physician Assistants and Nurse Practitioners) who all work together to provide you with the care you need, when you need it.  We recommend signing up for the patient portal called "MyChart".  Sign up information is provided on this After Visit Summary.  MyChart is used to connect with patients for Virtual Visits (Telemedicine).  Patients are able to view lab/test results, encounter notes, upcoming appointments, etc.  Non-urgent messages can be sent to your provider as well.   To learn more about what you can do with MyChart, go to [email or web address].    Your next appointment:    Follow up as needed  Provider:   Dr Royann Shivers    Signed, Thurmon Fair, MD  09/15/2022 6:16 PM    [facility]

## 2022-09-30 ENCOUNTER — Ambulatory Visit (HOSPITAL_COMMUNITY): Payer: 59 | Attending: Cardiovascular Disease

## 2022-09-30 DIAGNOSIS — R55 Syncope and collapse: Secondary | ICD-10-CM | POA: Diagnosis present

## 2022-09-30 LAB — ECHOCARDIOGRAM COMPLETE
Area-P 1/2: 3.62 cm2
S' Lateral: 2.3 cm

## 2022-10-15 ENCOUNTER — Ambulatory Visit (INDEPENDENT_AMBULATORY_CARE_PROVIDER_SITE_OTHER): Payer: 59

## 2022-10-15 DIAGNOSIS — D511 Vitamin B12 deficiency anemia due to selective vitamin B12 malabsorption with proteinuria: Secondary | ICD-10-CM

## 2022-10-15 MED ORDER — CYANOCOBALAMIN 1000 MCG/ML IJ SOLN
1000.0000 ug | Freq: Once | INTRAMUSCULAR | Status: AC
Start: 2022-10-15 — End: 2022-10-15
  Administered 2022-10-15: 1000 ug via INTRAMUSCULAR

## 2022-10-15 NOTE — Progress Notes (Signed)
Pt here for monthly B12 injection per Dr. Yetta Barre  B12 given IM. and pt tolerated injection well.

## 2022-10-27 ENCOUNTER — Encounter: Payer: Self-pay | Admitting: Internal Medicine

## 2022-10-29 DIAGNOSIS — I639 Cerebral infarction, unspecified: Secondary | ICD-10-CM | POA: Insufficient documentation

## 2022-10-29 DIAGNOSIS — H40009 Preglaucoma, unspecified, unspecified eye: Secondary | ICD-10-CM | POA: Insufficient documentation

## 2022-10-29 NOTE — Telephone Encounter (Signed)
Sounds classic vasovagal again. I do not think a loop recorder would alter the management.

## 2022-11-03 ENCOUNTER — Ambulatory Visit
Admission: RE | Admit: 2022-11-03 | Discharge: 2022-11-03 | Disposition: A | Discharge status: Home / Self Care | Payer: 59 | Source: Ambulatory Visit | Attending: Internal Medicine | Admitting: Internal Medicine

## 2022-11-03 DIAGNOSIS — Z1231 Encounter for screening mammogram for malignant neoplasm of breast: Secondary | ICD-10-CM

## 2022-11-17 ENCOUNTER — Ambulatory Visit: Payer: 59

## 2022-12-08 ENCOUNTER — Other Ambulatory Visit (HOSPITAL_COMMUNITY)
Admission: RE | Admit: 2022-12-08 | Discharge: 2022-12-08 | Disposition: A | Discharge status: Home / Self Care | Payer: 59 | Source: Ambulatory Visit | Attending: Obstetrics and Gynecology | Admitting: Obstetrics and Gynecology

## 2022-12-08 ENCOUNTER — Encounter: Payer: Self-pay | Admitting: Obstetrics and Gynecology

## 2022-12-08 ENCOUNTER — Ambulatory Visit (INDEPENDENT_AMBULATORY_CARE_PROVIDER_SITE_OTHER): Payer: 59 | Admitting: Obstetrics and Gynecology

## 2022-12-08 VITALS — BP 118/82 | Ht 68.5 in | Wt 175.0 lb

## 2022-12-08 DIAGNOSIS — Z0000 Encounter for general adult medical examination without abnormal findings: Secondary | ICD-10-CM | POA: Insufficient documentation

## 2022-12-08 DIAGNOSIS — Z1331 Encounter for screening for depression: Secondary | ICD-10-CM | POA: Diagnosis not present

## 2022-12-08 DIAGNOSIS — D219 Benign neoplasm of connective and other soft tissue, unspecified: Secondary | ICD-10-CM

## 2022-12-08 DIAGNOSIS — Z9889 Other specified postprocedural states: Secondary | ICD-10-CM

## 2022-12-08 DIAGNOSIS — Z01419 Encounter for gynecological examination (general) (routine) without abnormal findings: Secondary | ICD-10-CM | POA: Diagnosis not present

## 2022-12-08 DIAGNOSIS — R102 Pelvic and perineal pain: Secondary | ICD-10-CM

## 2022-12-08 NOTE — Progress Notes (Signed)
47 y.o. y.o. female here for annual exam. No LMP recorded. Patient has had an ablation.   H/o ablation. Would like a refill on her ocp's since she is having some RLQ pain and spotting again.  She would like to avoid having a hysterectomy.   Last mammogram: 11/03/22 Last colonoscopy: not in epic Body mass index is 26.22 kg/m.     12/08/2022    2:14 PM 06/12/2022    9:27 AM 01/11/2021   11:25 AM  Depression screen PHQ 2/9  Decreased Interest 1 0 0  Down, Depressed, Hopeless 1 0 0  PHQ - 2 Score 2 0 0  Altered sleeping 3 0   Tired, decreased energy 2 0   Change in appetite 0 0   Feeling bad or failure about yourself  1 0   Trouble concentrating 2 0   Moving slowly or fidgety/restless 3 0   Suicidal thoughts 0 0   PHQ-9 Score 13 0     Blood pressure 118/82, height 5' 8.5" (1.74 m), weight 175 lb (79.4 kg).  No results found for: "DIAGPAP", "HPVHIGH", "ADEQPAP"  GYN HISTORY: No results found for: "DIAGPAP", "HPVHIGH", "ADEQPAP" Remote abnormal "HPV" with prior partner.  Resolved when they broke up.  OB History  Gravida Para Term Preterm AB Living  5       3 2   SAB IAB Ectopic Multiple Live Births  3       2    # Outcome Date GA Lbr Len/2nd Weight Sex Type Anes PTL Lv  5 Gravida           4 Gravida           3 SAB           2 SAB           1 SAB             Past Medical History:  Diagnosis Date   Lupus    MS (multiple sclerosis) (HCC)     Past Surgical History:  Procedure Laterality Date   CATARACT EXTRACTION     GASTRIC BYPASS     TUBAL LIGATION      Current Outpatient Medications on File Prior to Visit  Medication Sig Dispense Refill   ferrous sulfate 325 (65 FE) MG tablet Take 1 tablet (325 mg total) by mouth daily with breakfast. 90 tablet 1   FLUoxetine (PROZAC) 20 MG capsule Take 3 capsules by mouth daily.     Multiple Vitamin (MULTI-VITAMIN) tablet Take 1 tablet by mouth daily.     valACYclovir (VALTREX) 500 MG tablet Take 1 tablet by mouth  daily.     zinc gluconate 50 MG tablet Take 1 tablet (50 mg total) by mouth daily. 90 tablet 1   No current facility-administered medications on file prior to visit.    Social History   Socioeconomic History   Marital status: Single    Spouse name: Not on file   Number of children: Not on file   Years of education: Not on file   Highest education level: Bachelor's degree (e.g., BA, AB, BS)  Occupational History   Not on file  Tobacco Use   Smoking status: Never   Smokeless tobacco: Never  Substance and Sexual Activity   Alcohol use: Yes    Alcohol/week: 2.0 standard drinks of alcohol    Types: 2 Glasses of wine per week    Comment: occ   Drug use: Never   Sexual activity:  Yes    Partners: Male  Other Topics Concern   Not on file  Social History Narrative   Not on file   Social Determinants of Health   Financial Resource Strain: Medium Risk (06/10/2022)   Overall Financial Resource Strain (CARDIA)    Difficulty of Paying Living Expenses: Somewhat hard  Food Insecurity: No Food Insecurity (06/10/2022)   Hunger Vital Sign    Worried About Running Out of Food in the Last Year: Never true    Ran Out of Food in the Last Year: Never true  Transportation Needs: No Transportation Needs (06/10/2022)   PRAPARE - Administrator, Civil Service (Medical): No    Lack of Transportation (Non-Medical): No  Physical Activity: Unknown (06/10/2022)   Exercise Vital Sign    Days of Exercise per Week: 0 days    Minutes of Exercise per Session: Not on file  Stress: Stress Concern Present (06/10/2022)   [employer] of Occupational Health - Occupational Stress Questionnaire    Feeling of Stress : To some extent  Social Connections: Moderately Integrated (06/10/2022)   Social Connection and Isolation Panel [NHANES]    Frequency of Communication with Friends and Family: More than three times a week    Frequency of Social Gatherings with Friends and Family: More than three times a week     Attends Religious Services: 1 to 4 times per year    Active Member of [employer] or Organizations: No    Attends Banker Meetings: Not on file    Marital Status: Married  Intimate Partner Violence: Not At Risk (07/05/2019)   Received from [facility] visits prior to 03/08/2022., [facility] visits prior to 03/08/2022.   Humiliation, Afraid, Rape, and Kick questionnaire    Fear of Current or Ex-Partner: No    Emotionally Abused: No    Physically Abused: No    Sexually Abused: No    Family History  Problem Relation Age of Onset   Hypertension Mother    Depression Mother      Allergies  Allergen Reactions   Topamax [Topiramate]       Patient's last menstrual period was No LMP recorded. Patient has had an ablation..            Review of Systems Alls systems reviewed and are negative.     Physical Exam Constitutional:      Appearance: Normal appearance.  Genitourinary:     Vulva and urethral meatus normal.     No lesions in the vagina.     Right Labia: No rash, lesions or skin changes.    Left Labia: No lesions, skin changes or rash.    No vaginal discharge or tenderness.     No vaginal prolapse present.    No vaginal atrophy present.     Right Adnexa: not tender, not palpable and no mass present.    Left Adnexa: not tender, not palpable and no mass present.    No cervical motion tenderness or discharge.     Uterus is enlarged and irregular.     Uterus is not tender.  Breasts:    Right: Normal.     Left: Normal.  HENT:     Head: Normocephalic.  Neck:     Thyroid: No thyroid mass, thyromegaly or thyroid tenderness.  Cardiovascular:     Rate and Rhythm: Normal rate and regular rhythm.     Heart sounds: Normal heart sounds, S1 normal and  S2 normal.  Pulmonary:     Effort: Pulmonary effort is normal.     Breath sounds: Normal breath sounds and air entry.  Abdominal:     General: There is no distension.      Palpations: Abdomen is soft. There is no mass.     Tenderness: There is no abdominal tenderness. There is no guarding or rebound.  Musculoskeletal:        General: Normal range of motion.     Cervical back: Full passive range of motion without pain, normal range of motion and neck supple. No tenderness.     Right lower leg: No edema.     Left lower leg: No edema.  Neurological:     Mental Status: She is alert.  Skin:    General: Skin is warm.  Psychiatric:        Mood and Affect: Mood normal.        Behavior: Behavior normal.        Thought Content: Thought content normal.  Vitals and nursing note reviewed. Exam conducted with a chaperone present.       A:         Well Woman GYN exam                             P:        Pap smear collected today Encouraged annual mammogram screening Colon cancer screening referral placed today Possible fibroids and RLQ pain: referral for PUS placed today Patient to notify us of her prior ocp's she would like to restart Encouraged healthy lifestyle practices   No follow-ups on file.  Earley Favor

## 2022-12-08 NOTE — Patient Instructions (Signed)
Referral for colonoscopy was placed.  This starts now at 45 for colon cancer screening.  If you have done, please disregard this. Please let [place] know the name of the birth control you were on before. Dr. Karma Greaser

## 2022-12-09 LAB — SURESWAB® ADVANCED VAGINITIS PLUS,TMA
C. trachomatis RNA, TMA: NOT DETECTED
CANDIDA SPECIES: NOT DETECTED
Candida glabrata: NOT DETECTED
N. gonorrhoeae RNA, TMA: NOT DETECTED
SURESWAB(R) ADV BACTERIAL VAGINOSIS(BV),TMA: NEGATIVE
TRICHOMONAS VAGINALIS (TV),TMA: NOT DETECTED

## 2022-12-09 MED ORDER — NORETHINDRONE 0.35 MG PO TABS: 1.0000 | ORAL_TABLET | Freq: Every day | ORAL | 11 refills | Status: DC

## 2022-12-10 LAB — CYTOLOGY - PAP
Comment: NEGATIVE
Diagnosis: NEGATIVE
High risk HPV: NEGATIVE

## 2022-12-17 ENCOUNTER — Ambulatory Visit: Payer: 59 | Admitting: Internal Medicine

## 2023-01-12 ENCOUNTER — Encounter: Payer: Self-pay | Admitting: Obstetrics and Gynecology

## 2023-01-12 DIAGNOSIS — R102 Pelvic and perineal pain unspecified side: Secondary | ICD-10-CM

## 2023-01-12 DIAGNOSIS — D219 Benign neoplasm of connective and other soft tissue, unspecified: Secondary | ICD-10-CM

## 2023-01-12 DIAGNOSIS — Z9889 Other specified postprocedural states: Secondary | ICD-10-CM

## 2023-01-13 NOTE — Telephone Encounter (Signed)
 Spoke with patient. Patient has open order for PUS at external location ro r/o fibroids, pelvic pain and Hx of ablation.   PUS scheduled at [facility] for 02/19/23 at 1500, consult to follow at 1530 with Dr. Glennon. Patient aware can further discuss menopause concerns at consult. Return call to office if any new symptoms develop or symptoms become worse.    New PUS order placed.   Routing to provider for final review. Patient is agreeable to disposition. Will close encounter.

## 2023-02-19 ENCOUNTER — Encounter: Payer: Self-pay | Admitting: Obstetrics and Gynecology

## 2023-02-19 ENCOUNTER — Ambulatory Visit: Payer: 59 | Admitting: Obstetrics and Gynecology

## 2023-02-19 ENCOUNTER — Ambulatory Visit (INDEPENDENT_AMBULATORY_CARE_PROVIDER_SITE_OTHER): Payer: 59

## 2023-02-19 VITALS — BP 120/80 | HR 72 | Resp 16

## 2023-02-19 DIAGNOSIS — D219 Benign neoplasm of connective and other soft tissue, unspecified: Secondary | ICD-10-CM | POA: Diagnosis not present

## 2023-02-19 DIAGNOSIS — N898 Other specified noninflammatory disorders of vagina: Secondary | ICD-10-CM

## 2023-02-19 DIAGNOSIS — Z9889 Other specified postprocedural states: Secondary | ICD-10-CM

## 2023-02-19 DIAGNOSIS — N951 Menopausal and female climacteric states: Secondary | ICD-10-CM | POA: Diagnosis not present

## 2023-02-19 DIAGNOSIS — B3731 Acute candidiasis of vulva and vagina: Secondary | ICD-10-CM | POA: Diagnosis not present

## 2023-02-19 DIAGNOSIS — R102 Pelvic and perineal pain unspecified side: Secondary | ICD-10-CM

## 2023-02-19 LAB — WET PREP FOR TRICH, YEAST, CLUE

## 2023-02-19 MED ORDER — FLUCONAZOLE 150 MG PO TABS: 150.0000 mg | ORAL_TABLET | Freq: Once | ORAL | 1 refills | Status: DC

## 2023-02-19 NOTE — Progress Notes (Signed)
Presents today for PUS   H/o ablation/tubal With severe RLQ pain has been extensive work-up. The pain would originally come once a month and now is lasts a longer time frame.  The pain is unbearable and she has been dealing with this for years and would like to move forward with the [facility], bilateral salpingectomy, cystoscopy [place] today with normal endometrial lining and fibroids She also feels like she is getting a bv infection and is having bothersome hotflashes SVE: possible BV, no lesions  Wet mount with yeast   PUS 9.03 cm  3 intramural fibroids noted 4.93cm, 2.63cm, 0.68cm Ablation appearance to EM No masses or thickening seen Both ovaries with normal size and perfusion  No adnexal masses No free fluid  A/p fibroids, pelvic pain, likely entrapped fluid from tubal and ablation Diflucan sent to pharmacy for yeast infection Symptomatic fibroid uterus likely entrapped blood from ablation and tubal.  Counseled on classic pain she is experiencing.  Counseled on all options.  She would like to have the [facility].  Counseled extensively on the procedure including but not limited to what to expect and risks and benefits.  Counseled on postop care and pelvic rest for 10 weeks after the surgery with restricted lifting for 6 weeks after.  Counseled on the benefits of the robotic procedure with faster return to daily activities, improved outcomes, and less risk for complications. She would like to have this scheduled. Discussed and counseled on not having the EMB prior to surgery with history of ablation and normal endometrial lining.  Discussed risk for an additional surgery, in the event cancer is found 1% risk. Discussed even with biopsy there can be missed pathology, since the lining is scarred and can entrap the specimen above the scarring.  She agreed. 20 minutes spent on reviewing records, imaging,  and one on one patient time and counseling patient and documentation Dr. Karma Greaser

## 2023-02-20 ENCOUNTER — Encounter: Payer: Self-pay | Admitting: Obstetrics and Gynecology

## 2023-02-20 LAB — FOLLICLE STIMULATING HORMONE: FSH: 9.7 m[IU]/mL

## 2023-02-20 LAB — VITAMIN D 25 HYDROXY (VIT D DEFICIENCY, FRACTURES): Vit D, 25-Hydroxy: 24 ng/mL — ABNORMAL LOW (ref 30–100)

## 2023-02-20 LAB — ESTRADIOL: Estradiol: 86 pg/mL

## 2023-03-11 ENCOUNTER — Encounter: Payer: Self-pay | Admitting: Obstetrics and Gynecology

## 2023-03-18 ENCOUNTER — Ambulatory Visit: Payer: 59 | Admitting: Internal Medicine

## 2023-03-31 ENCOUNTER — Other Ambulatory Visit: Payer: Self-pay

## 2023-03-31 ENCOUNTER — Ambulatory Visit: Admitting: Internal Medicine

## 2023-03-31 NOTE — Telephone Encounter (Signed)
 Medication refill request: Micronor  Last ov:  02/19/23 Next AEX: not scheduled  Last MMG (if hormonal medication request): n/a Refill authorized: [facility]

## 2023-04-01 MED ORDER — NORETHINDRONE 0.35 MG PO TABS: 1.0000 | ORAL_TABLET | Freq: Every day | ORAL | 3 refills | Status: AC

## 2023-04-01 NOTE — Telephone Encounter (Signed)
 See surgery referral. Left message for patient to call back on 03/09/23.

## 2023-04-22 LAB — BASIC METABOLIC PANEL WITH GFR
BUN: 9 (ref 4–21)
CO2: 24 — AB (ref 13–22)
Chloride: 105 (ref 99–108)
Creatinine: 0.7 (ref 0.5–1.1)
Glucose: 75
Potassium: 3.6 meq/L (ref 3.5–5.1)
Sodium: 139 (ref 137–147)

## 2023-04-22 LAB — HEPATIC FUNCTION PANEL
ALT: 25 U/L (ref 7–35)
AST: 32 (ref 13–35)
Alkaline Phosphatase: 52 (ref 25–125)
Bilirubin, Total: 0.3

## 2023-04-22 LAB — CBC AND DIFFERENTIAL
HCT: 36 (ref 36–46)
Hemoglobin: 11.2 — AB (ref 12.0–16.0)
Platelets: 370 10*3/uL (ref 150–400)
WBC: 5.9

## 2023-04-22 LAB — COMPREHENSIVE METABOLIC PANEL WITH GFR
Albumin: 3.8 (ref 3.5–5.0)
Calcium: 8.9 (ref 8.7–10.7)
eGFR: 108

## 2023-04-22 LAB — VITAMIN D 25 HYDROXY (VIT D DEFICIENCY, FRACTURES): Vit D, 25-Hydroxy: 23

## 2023-04-22 LAB — CBC: RBC: 4.07 (ref 3.87–5.11)

## 2023-04-23 ENCOUNTER — Encounter: Payer: Self-pay | Admitting: Internal Medicine

## 2023-04-23 ENCOUNTER — Ambulatory Visit: Admitting: Internal Medicine

## 2023-04-23 VITALS — BP 162/96 | HR 64 | Temp 98.0°F | Resp 16 | Ht 68.5 in | Wt 177.6 lb

## 2023-04-23 DIAGNOSIS — D500 Iron deficiency anemia secondary to blood loss (chronic): Secondary | ICD-10-CM | POA: Insufficient documentation

## 2023-04-23 DIAGNOSIS — D511 Vitamin B12 deficiency anemia due to selective vitamin B12 malabsorption with proteinuria: Secondary | ICD-10-CM

## 2023-04-23 DIAGNOSIS — Z1211 Encounter for screening for malignant neoplasm of colon: Secondary | ICD-10-CM | POA: Diagnosis not present

## 2023-04-23 DIAGNOSIS — R002 Palpitations: Secondary | ICD-10-CM | POA: Diagnosis not present

## 2023-04-23 DIAGNOSIS — Z9884 Bariatric surgery status: Secondary | ICD-10-CM

## 2023-04-23 DIAGNOSIS — I10 Essential (primary) hypertension: Secondary | ICD-10-CM | POA: Diagnosis not present

## 2023-04-23 DIAGNOSIS — F5104 Psychophysiologic insomnia: Secondary | ICD-10-CM

## 2023-04-23 DIAGNOSIS — D508 Other iron deficiency anemias: Secondary | ICD-10-CM

## 2023-04-23 DIAGNOSIS — D539 Nutritional anemia, unspecified: Secondary | ICD-10-CM

## 2023-04-23 LAB — URINALYSIS, ROUTINE W REFLEX MICROSCOPIC
Bilirubin Urine: NEGATIVE
Hgb urine dipstick: NEGATIVE
Leukocytes,Ua: NEGATIVE
Nitrite: NEGATIVE
Specific Gravity, Urine: >=1.03 — AB (ref 1.000–1.030)
Total Protein, Urine: NEGATIVE
Urine Glucose: NEGATIVE
Urobilinogen, UA: 0.2 (ref 0.0–1.0)
pH: 6 (ref 5.0–8.0)

## 2023-04-23 LAB — IBC + FERRITIN
Ferritin: 4.9 ng/mL — ABNORMAL LOW (ref 10.0–291.0)
Iron: 23 ug/dL — ABNORMAL LOW (ref 42–145)
Saturation Ratios: 5.4 % — ABNORMAL LOW (ref 20.0–50.0)
TIBC: 425.6 ug/dL (ref 250.0–450.0)
Transferrin: 304 mg/dL (ref 212.0–360.0)

## 2023-04-23 LAB — TSH: TSH: 2.2 u[IU]/mL (ref 0.35–5.50)

## 2023-04-23 LAB — FOLATE: Folate: 12.7 ng/mL (ref >5.9)

## 2023-04-23 MED ORDER — ACCRUFER 30 MG PO CAPS: 1.0000 | ORAL_CAPSULE | Freq: Two times a day (BID) | ORAL | 0 refills | Status: DC

## 2023-04-23 MED ORDER — TRIAMTERENE-HCTZ 37.5-25 MG PO CAPS: 1.0000 | ORAL_CAPSULE | Freq: Every day | ORAL | 0 refills | Status: DC

## 2023-04-23 MED ORDER — ESZOPICLONE 3 MG PO TABS: 3.0000 mg | ORAL_TABLET | Freq: Every day | ORAL | 0 refills | Status: DC

## 2023-04-23 MED ORDER — CYANOCOBALAMIN 1000 MCG/ML IJ SOLN: 1000.0000 ug | Freq: Once | INTRAMUSCULAR | Status: DC

## 2023-04-23 NOTE — Progress Notes (Signed)
 Subjective:  Patient ID: Sabrina Owen, female    DOB: [birth year]  Age: 48 y.o. MRN: [id number]  CC: Hypertension and Anemia   HPI Sabrina Owen presents for f/up ----  Discussed the use of AI scribe software for clinical note transcription with the patient, who gave verbal consent to proceed.  History of Present Illness   Sabrina Owen is a 48 year old female with multiple sclerosis who presents for follow-up regarding her MS symptoms and management.  She was seen at [facility] yesterday for her multiple sclerosis. She experiences issues with balance, decreased reflexes, and reduced sensation in her feet. She also reports pain described as 'fire' down her legs and in her hips during physical activity, likely related to nerve issues. Tingling in her legs is present, but no numbness or weakness. Despite these symptoms, she remains active and feels good when active.  She experiences severe pain every other month, coinciding with when her menstrual cycle would occur, despite not having menstrual cycles. This pain is described as similar to a kidney stone and is attributed to inflammation of the fallopian tube, possibly related to a past tubal ligation. Birth control pills provide some relief from this pain. She occasionally experiences nausea when the pain occurs, but no vomiting, fever, chills, or urinary symptoms.  Her blood pressure has been elevated at a recent neurology visit. She reports headaches and eye pain, which she cannot distinguish from each other. She monitors her blood pressure, which has been trending upwards. She has a history of bariatric surgery and has not gained significant weight since then.  She reports stress related to financial issues, including taxes and the need for a root canal, which she cannot afford. She is out of amoxicillin  for her dental pain.       Outpatient Medications Prior to Visit  Medication Sig Dispense Refill   amphetamine-dextroamphetamine  (ADDERALL XR) 20 MG 24 hr capsule Take 20 mg by mouth as needed.     FLUoxetine (PROZAC) 20 MG capsule Take 3 capsules by mouth daily.     Multiple Vitamin (MULTI-VITAMIN) tablet Take 1 tablet by mouth daily.     norethindrone  (ORTHO MICRONOR ) 0.35 MG tablet Take 1 tablet (0.35 mg total) by mouth daily. 84 tablet 3   valACYclovir (VALTREX) 500 MG tablet Take 1 tablet by mouth daily.     ferrous sulfate  325 (65 FE) MG tablet Take 1 tablet (325 mg total) by mouth daily with breakfast. 90 tablet 1   No facility-administered medications prior to visit.    ROS Review of Systems  Constitutional:  Positive for fatigue and unexpected weight change (wt gain). Negative for appetite change, chills and diaphoresis.  Eyes:  Positive for visual disturbance.  Respiratory:  Negative for cough, chest tightness, shortness of breath and wheezing.   Cardiovascular:  Positive for palpitations. Negative for chest pain and leg swelling.  Gastrointestinal: Negative.  Negative for abdominal pain, blood in stool, constipation, diarrhea, nausea and vomiting.  Genitourinary: Negative.  Negative for difficulty urinating, dysuria and hematuria.  Musculoskeletal:  Positive for gait problem. Negative for arthralgias, back pain, joint swelling, myalgias and neck pain.  Skin: Negative.   Neurological:  Positive for numbness and headaches. Negative for dizziness, speech difficulty, weakness and light-headedness.  Hematological:  Negative for adenopathy. Does not bruise/bleed easily.  Psychiatric/Behavioral:  Positive for sleep disturbance. Negative for agitation, behavioral problems, confusion, decreased concentration, dysphoric mood, hallucinations and suicidal ideas. The patient is not nervous/anxious and is not hyperactive.  Objective:  BP (!) 162/96 (BP Location: Left Arm, Patient Position: Sitting, Cuff Size: Normal)   Pulse 64   Temp 98 F (36.7 C) (Oral)   Resp 16   Ht 5' 8.5" (1.74 m)   Wt 177 lb 9.6 oz  (80.6 kg)   SpO2 99%   BMI 26.61 kg/m   BP Readings from Last 3 Encounters:  04/23/23 (!) 162/96  02/19/23 120/80  12/08/22 118/82    Wt Readings from Last 3 Encounters:  04/23/23 177 lb 9.6 oz (80.6 kg)  12/08/22 175 lb (79.4 kg)  09/15/22 165 lb 6.4 oz (75 kg)    Physical Exam Vitals reviewed.  Constitutional:      Appearance: Normal appearance.  HENT:     Nose: Nose normal.     Mouth/Throat:     Mouth: Mucous membranes are moist.  Eyes:     General: No scleral icterus.    Conjunctiva/sclera: Conjunctivae normal.  Cardiovascular:     Rate and Rhythm: Normal rate and regular rhythm.     Heart sounds: No murmur heard.    No friction rub. No gallop.     Comments: EKG--- NSR, 63 bpm No LVH, Q waves, or ST/T wave changes  Pulmonary:     Effort: Pulmonary effort is normal.     Breath sounds: No stridor. No wheezing, rhonchi or rales.  Abdominal:     General: Abdomen is flat.     Palpations: There is no mass.     Tenderness: There is no abdominal tenderness. There is no guarding or rebound.     Hernia: No hernia is present.  Musculoskeletal:        General: Normal range of motion.     Cervical back: Neck supple.     Right lower leg: No edema.     Left lower leg: No edema.  Lymphadenopathy:     Cervical: No cervical adenopathy.  Skin:    General: Skin is warm and dry.  Neurological:     Mental Status: She is alert and oriented to person, place, and time. Mental status is at baseline.  Psychiatric:        Mood and Affect: Mood normal.        Behavior: Behavior normal.     Lab Results  Component Value Date   WBC 5.9 04/22/2023   HGB 11.2 (A) 04/22/2023   HCT 36 04/22/2023   PLT 370 04/22/2023   GLUCOSE 83 10/04/2021   CHOL 131 07/11/2020   TRIG 56 07/11/2020   HDL 58 07/11/2020   LDLCALC 64 07/11/2020   ALT 25 04/22/2023   AST 32 04/22/2023   NA 139 04/22/2023   K 3.6 04/22/2023   CL 105 04/22/2023   CREATININE 0.7 04/22/2023   BUN 9 04/22/2023    CO2 24 (A) 04/22/2023   TSH 2.20 04/23/2023    No results found.  Assessment & Plan:   Palpitations- EKG is normal. -     EKG 12-Lead -     TSH; Future  Hypertension, unspecified type- Her BP is not a goal and she is symptomatic. EKG is negative for LVH. Will start dyazide. -     EKG 12-Lead -     Aldosterone + renin activity w/ ratio; Future -     Urinalysis, Routine w reflex microscopic; Future -     Triamterene -HCTZ; Take 1 each (1 capsule total) by mouth daily.  Dispense: 90 capsule; Refill: 0  Deficiency anemia -  Zinc ; Future -     Vitamin B1; Future -     IBC + Ferritin; Future -     Folate; Future  Screening for colon cancer -     Ambulatory referral to Gastroenterology  Psychophysiological insomnia -     Eszopiclone ; Take 1 tablet (3 mg total) by mouth at bedtime. 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 -     ACCRUFeR ; Take 1 capsule (30 mg total) by mouth in the morning and at bedtime.  Dispense: 180 capsule; Refill: 0  Gastric bypass status for obesity -     ACCRUFeR ; Take 1 capsule (30 mg total) by mouth in the morning and at bedtime.  Dispense: 180 capsule; Refill: 0     Follow-up: Return in about 3 months (around 07/23/2023).  Sandra Crouch, MD

## 2023-04-23 NOTE — Patient Instructions (Signed)
 Hypertension, Adult High blood pressure (hypertension) is when the force of blood pumping through the arteries is too strong. The arteries are the blood vessels that carry blood from the heart throughout the body. Hypertension forces the heart to work harder to pump blood and may cause arteries to become narrow or stiff. Untreated or uncontrolled hypertension can lead to a heart attack, heart failure, a stroke, kidney disease, and other problems. A blood pressure reading consists of a higher number over a lower number. Ideally, your blood pressure should be below 120/80. The first ("top") number is called the systolic pressure. It is a measure of the pressure in your arteries as your heart beats. The second ("bottom") number is called the diastolic pressure. It is a measure of the pressure in your arteries as the heart relaxes. What are the causes? The exact cause of this condition is not known. There are some conditions that result in high blood pressure. What increases the risk? Certain factors may make you more likely to develop high blood pressure. Some of these risk factors are under your control, including: Smoking. Not getting enough exercise or physical activity. Being overweight. Having too much fat, sugar, calories, or salt (sodium) in your diet. Drinking too much alcohol. Other risk factors include: Having a personal history of heart disease, diabetes, high cholesterol, or kidney disease. Stress. Having a family history of high blood pressure and high cholesterol. Having obstructive sleep apnea. Age. The risk increases with age. What are the signs or symptoms? High blood pressure may not cause symptoms. Very high blood pressure (hypertensive crisis) may cause: Headache. Fast or irregular heartbeats (palpitations). Shortness of breath. Nosebleed. Nausea and vomiting. Vision changes. Severe chest pain, dizziness, and seizures. How is this diagnosed? This condition is diagnosed by  measuring your blood pressure while you are seated, with your arm resting on a flat surface, your legs uncrossed, and your feet flat on the floor. The cuff of the blood pressure monitor will be placed directly against the skin of your upper arm at the level of your heart. Blood pressure should be measured at least twice using the same arm. Certain conditions can cause a difference in blood pressure between your right and left arms. If you have a high blood pressure reading during one visit or you have normal blood pressure with other risk factors, you may be asked to: Return on a different day to have your blood pressure checked again. Monitor your blood pressure at home for 1 week or longer. If you are diagnosed with hypertension, you may have other blood or imaging tests to help your health care provider understand your overall risk for other conditions. How is this treated? This condition is treated by making healthy lifestyle changes, such as eating healthy foods, exercising more, and reducing your alcohol intake. You may be referred for counseling on a healthy diet and physical activity. Your health care provider may prescribe medicine if lifestyle changes are not enough to get your blood pressure under control and if: Your systolic blood pressure is above 130. Your diastolic blood pressure is above 80. Your personal target blood pressure may vary depending on your medical conditions, your age, and other factors. Follow these instructions at home: Eating and drinking  Eat a diet that is high in fiber and potassium, and low in sodium, added sugar, and fat. An example of this eating plan is called the DASH diet. DASH stands for Dietary Approaches to Stop Hypertension. To eat this way: Eat  plenty of fresh fruits and vegetables. Try to fill one half of your plate at each meal with fruits and vegetables. Eat whole grains, such as whole-wheat pasta, brown rice, or whole-grain bread. Fill about one  fourth of your plate with whole grains. Eat or drink low-fat dairy products, such as skim milk or low-fat yogurt. Avoid fatty cuts of meat, processed or cured meats, and poultry with skin. Fill about one fourth of your plate with lean proteins, such as fish, chicken without skin, beans, eggs, or tofu. Avoid pre-made and processed foods. These tend to be higher in sodium, added sugar, and fat. Reduce your daily sodium intake. Many people with hypertension should eat less than 1,500 mg of sodium a day. Do not drink alcohol if: Your health care provider tells you not to drink. You are pregnant, may be pregnant, or are planning to become pregnant. If you drink alcohol: Limit how much you have to: 0-1 drink a day for women. 0-2 drinks a day for men. Know how much alcohol is in your drink. In the [place] one drink equals one 12 oz bottle of beer (355 mL), one 5 oz glass of wine (148 mL), or one 1 oz glass of hard liquor (44 mL). Lifestyle  Work with your health care provider to maintain a healthy body weight or to lose weight. Ask what an ideal weight is for you. Get at least 30 minutes of exercise that causes your heart to beat faster (aerobic exercise) most days of the week. Activities may include walking, swimming, or biking. Include exercise to strengthen your muscles (resistance exercise), such as Pilates or lifting weights, as part of your weekly exercise routine. Try to do these types of exercises for 30 minutes at least 3 days a week. Do not use any products that contain nicotine or tobacco. These products include cigarettes, chewing tobacco, and vaping devices, such as e-cigarettes. If you need help quitting, ask your health care provider. Monitor your blood pressure at home as told by your health care provider. Keep all follow-up visits. This is important. Medicines Take over-the-counter and prescription medicines only as told by your health care provider. Follow directions carefully. Blood  pressure medicines must be taken as prescribed. Do not skip doses of blood pressure medicine. Doing this puts you at risk for problems and can make the medicine less effective. Ask your health care provider about side effects or reactions to medicines that you should watch for. Contact a health care provider if you: Think you are having a reaction to a medicine you are taking. Have headaches that keep coming back (recurring). Feel dizzy. Have swelling in your ankles. Have trouble with your vision. Get help right away if you: Develop a severe headache or confusion. Have unusual weakness or numbness. Feel faint. Have severe pain in your chest or abdomen. Vomit repeatedly. Have trouble breathing. These symptoms may be an emergency. Get help right away. Call 911. Do not wait to see if the symptoms will go away. Do not drive yourself to the hospital. Summary Hypertension is when the force of blood pumping through your arteries is too strong. If this condition is not controlled, it may put you at risk for serious complications. Your personal target blood pressure may vary depending on your medical conditions, your age, and other factors. For most people, a normal blood pressure is less than 120/80. Hypertension is treated with lifestyle changes, medicines, or a combination of both. Lifestyle changes include losing weight, eating a healthy,  low-sodium diet, exercising more, and limiting alcohol. This information is not intended to replace advice given to you by your health care provider. Make sure you discuss any questions you have with your health care provider. Document Revised: 10/30/2020 Document Reviewed: 10/30/2020 Elsevier Patient Education  2024 [employer].

## 2023-04-24 ENCOUNTER — Encounter: Payer: Self-pay | Admitting: Internal Medicine

## 2023-04-29 ENCOUNTER — Other Ambulatory Visit: Payer: Self-pay | Admitting: Internal Medicine

## 2023-04-29 ENCOUNTER — Telehealth: Payer: Self-pay

## 2023-04-29 NOTE — Addendum Note (Signed)
 Addended by: Arcadio Knuckles on: 04/29/2023 07:57 AM   Modules accepted: Orders

## 2023-04-29 NOTE — Telephone Encounter (Signed)
 Dr. Rochelle Chu, patient will be scheduled as soon as possible.  Auth Submission: NO AUTH NEEDED Site of care: Site of care: [facility] Payer: [employer] commercial Medication & CPT/J Code(s) submitted: Venofer (Iron Sucrose) J1756 Route of submission (phone, fax, portal):  Phone # Fax # Auth type: Buy/Bill PB Units/visits requested: 200mg  x 5 doses Reference number:  Approval from: 04/29/23 to 09/29/23

## 2023-04-30 ENCOUNTER — Other Ambulatory Visit: Payer: Self-pay | Admitting: Internal Medicine

## 2023-04-30 DIAGNOSIS — D538 Other specified nutritional anemias: Secondary | ICD-10-CM

## 2023-04-30 LAB — ALDOSTERONE + RENIN ACTIVITY W/ RATIO
ALDO / PRA Ratio: 9.8 ratio (ref 0.9–28.9)
Aldosterone: 5 ng/dL
Renin Activity: 0.51 ng/mL/h (ref 0.25–5.82)

## 2023-04-30 LAB — VITAMIN B1: Vitamin B1 (Thiamine): 8 nmol/L (ref 8–30)

## 2023-04-30 LAB — ZINC: Zinc: 55 ug/dL — ABNORMAL LOW (ref 60–130)

## 2023-04-30 MED ORDER — ZINC GLUCONATE 50 MG PO TABS: 50.0000 mg | ORAL_TABLET | Freq: Every day | ORAL | 1 refills | Status: DC

## 2023-05-04 ENCOUNTER — Ambulatory Visit

## 2023-05-04 MED ORDER — IRON SUCROSE 20 MG/ML IV SOLN: 200.0000 mg | Freq: Once | INTRAVENOUS | Status: DC

## 2023-05-06 ENCOUNTER — Ambulatory Visit

## 2023-05-06 ENCOUNTER — Other Ambulatory Visit: Payer: Self-pay | Admitting: Family Medicine

## 2023-05-06 ENCOUNTER — Encounter: Payer: Self-pay | Admitting: Internal Medicine

## 2023-05-06 VITALS — BP 141/79 | HR 78 | Temp 98.2°F | Resp 16 | Ht 68.0 in | Wt 177.0 lb

## 2023-05-06 DIAGNOSIS — D508 Other iron deficiency anemias: Secondary | ICD-10-CM

## 2023-05-06 DIAGNOSIS — D509 Iron deficiency anemia, unspecified: Secondary | ICD-10-CM

## 2023-05-06 MED ORDER — SODIUM CHLORIDE 0.9 % IV BOLUS
100.0000 mL | Freq: Once | INTRAVENOUS | Status: AC
  Administered 2023-05-06: 100 mL via INTRAVENOUS
  Filled 2023-05-06: qty 100

## 2023-05-06 MED ORDER — IRON SUCROSE 20 MG/ML IV SOLN
200.0000 mg | Freq: Once | INTRAVENOUS | Status: AC
Start: 2023-05-06 — End: 2023-05-06
  Administered 2023-05-06: 200 mg via INTRAVENOUS
  Filled 2023-05-06: qty 10

## 2023-05-06 NOTE — Progress Notes (Signed)
 Diagnosis: Iron Deficiency Anemia  Provider:  Chilton Greathouse MD  Procedure: IV Push  IV Type: Peripheral, IV Location: L Hand  Venofer (Iron Sucrose), Dose: 200 mg  Post Infusion IV Care: Observation period completed and Peripheral IV Discontinued  Discharge: Condition: Good, Destination: Home . AVS Provided  Performed by:  Garnette Czech, RN

## 2023-05-07 ENCOUNTER — Encounter: Payer: Self-pay | Admitting: Internal Medicine

## 2023-05-07 ENCOUNTER — Encounter: Admitting: Obstetrics and Gynecology

## 2023-05-07 ENCOUNTER — Telehealth: Payer: Self-pay

## 2023-05-07 DIAGNOSIS — D500 Iron deficiency anemia secondary to blood loss (chronic): Secondary | ICD-10-CM | POA: Insufficient documentation

## 2023-05-07 NOTE — Telephone Encounter (Signed)
 Dr. Rochelle Chu, patient will be scheduled as soon as possible.  Auth Submission: NO AUTH NEEDED Site of care: Site of care: [facility] Payer: [employer] commercial Medication & CPT/J Code(s) submitted: Venofer  (Iron  Sucrose) J1756 Route of submission (phone, fax, portal): portal Phone # Fax # Auth type: Buy/Bill PB Units/visits requested: 300mg  x 2 doses Reference number:  Approval from: 05/07/23 to 09/07/23

## 2023-05-08 ENCOUNTER — Other Ambulatory Visit: Payer: Self-pay | Admitting: Internal Medicine

## 2023-05-08 DIAGNOSIS — D500 Iron deficiency anemia secondary to blood loss (chronic): Secondary | ICD-10-CM

## 2023-05-11 ENCOUNTER — Ambulatory Visit

## 2023-05-11 VITALS — BP 131/84 | HR 65 | Temp 98.0°F | Resp 16 | Ht 68.0 in | Wt 174.2 lb

## 2023-05-11 DIAGNOSIS — D509 Iron deficiency anemia, unspecified: Secondary | ICD-10-CM | POA: Diagnosis not present

## 2023-05-11 DIAGNOSIS — D508 Other iron deficiency anemias: Secondary | ICD-10-CM

## 2023-05-11 DIAGNOSIS — D500 Iron deficiency anemia secondary to blood loss (chronic): Secondary | ICD-10-CM

## 2023-05-11 MED ORDER — SODIUM CHLORIDE 0.9 % IV SOLN
300.0000 mg | Freq: Once | INTRAVENOUS | Status: AC
  Administered 2023-05-11: 300 mg via INTRAVENOUS
  Filled 2023-05-11: qty 15

## 2023-05-11 MED ORDER — IRON SUCROSE 300 MG IVPB - SIMPLE MED: 300.0000 mg | Freq: Once | Status: DC

## 2023-05-11 MED ORDER — IRON SUCROSE 300 MG IVPB - SIMPLE MED
300.0000 mg | Freq: Once | Status: DC
Start: 2023-05-11 — End: 2023-05-11

## 2023-05-11 NOTE — Progress Notes (Signed)
 Diagnosis: Iron  Deficiency Anemia  Provider:  Praveen Mannam MD  Procedure: IV Infusion  IV Type: Peripheral, IV Location: R Forearm  Venofer  (Iron  Sucrose), Dose: 300 mg  Infusion Start Time: 1350  Infusion Stop Time: 1530  Post Infusion IV Care: Observation period completed and Peripheral IV Discontinued  Discharge: Condition: Good, Destination: Home . AVS Declined  Performed by:  Krishiv Sandler, RN

## 2023-05-13 ENCOUNTER — Ambulatory Visit

## 2023-05-18 ENCOUNTER — Ambulatory Visit

## 2023-05-20 ENCOUNTER — Ambulatory Visit

## 2023-05-20 VITALS — BP 134/75 | HR 69 | Temp 98.1°F | Resp 18 | Ht 68.0 in | Wt 180.8 lb

## 2023-05-20 DIAGNOSIS — D509 Iron deficiency anemia, unspecified: Secondary | ICD-10-CM | POA: Diagnosis not present

## 2023-05-20 DIAGNOSIS — D500 Iron deficiency anemia secondary to blood loss (chronic): Secondary | ICD-10-CM

## 2023-05-20 DIAGNOSIS — D508 Other iron deficiency anemias: Secondary | ICD-10-CM

## 2023-05-20 MED ORDER — SODIUM CHLORIDE 0.9 % IV SOLN
300.0000 mg | Freq: Once | INTRAVENOUS | Status: AC
  Administered 2023-05-20: 300 mg via INTRAVENOUS
  Filled 2023-05-20: qty 15

## 2023-05-20 NOTE — Progress Notes (Signed)
 Diagnosis: Iron  Deficiency Anemia  Provider:  Praveen Mannam MD  Procedure: IV Infusion  IV Type: Peripheral, IV Location: L Hand  Venofer  (Iron  Sucrose), Dose: 300 mg  Infusion Start Time: 1420  Infusion Stop Time: 1620  Post Infusion IV Care: Observation period completed and Peripheral IV Discontinued  Discharge: Condition: Good, Destination: Home . AVS Declined  Performed by:  Kelby Adell, RN

## 2023-05-22 ENCOUNTER — Ambulatory Visit (HOSPITAL_COMMUNITY): Admit: 2023-05-22 | Admitting: Obstetrics and Gynecology

## 2023-05-22 SURGERY — HYSTERECTOMY, TOTAL, LAPAROSCOPIC, ROBOT-ASSISTED WITH SALPINGECTOMY
Anesthesia: General

## 2023-05-29 ENCOUNTER — Encounter: Payer: Self-pay | Admitting: Hematology and Oncology

## 2023-05-29 ENCOUNTER — Inpatient Hospital Stay

## 2023-05-29 ENCOUNTER — Inpatient Hospital Stay: Attending: Hematology and Oncology | Admitting: Hematology and Oncology

## 2023-05-29 DIAGNOSIS — Z9884 Bariatric surgery status: Secondary | ICD-10-CM | POA: Diagnosis not present

## 2023-05-29 DIAGNOSIS — Z808 Family history of malignant neoplasm of other organs or systems: Secondary | ICD-10-CM | POA: Diagnosis not present

## 2023-05-29 DIAGNOSIS — D539 Nutritional anemia, unspecified: Secondary | ICD-10-CM

## 2023-05-29 DIAGNOSIS — Z800 Family history of malignant neoplasm of digestive organs: Secondary | ICD-10-CM | POA: Insufficient documentation

## 2023-05-29 DIAGNOSIS — K912 Postsurgical malabsorption, not elsewhere classified: Secondary | ICD-10-CM | POA: Diagnosis not present

## 2023-05-29 DIAGNOSIS — Z8042 Family history of malignant neoplasm of prostate: Secondary | ICD-10-CM | POA: Diagnosis not present

## 2023-05-29 DIAGNOSIS — Z809 Family history of malignant neoplasm, unspecified: Secondary | ICD-10-CM | POA: Insufficient documentation

## 2023-05-29 DIAGNOSIS — G35 Multiple sclerosis: Secondary | ICD-10-CM | POA: Insufficient documentation

## 2023-05-29 DIAGNOSIS — Z801 Family history of malignant neoplasm of trachea, bronchus and lung: Secondary | ICD-10-CM | POA: Insufficient documentation

## 2023-05-29 DIAGNOSIS — Z79899 Other long term (current) drug therapy: Secondary | ICD-10-CM | POA: Diagnosis not present

## 2023-05-29 DIAGNOSIS — E559 Vitamin D deficiency, unspecified: Secondary | ICD-10-CM | POA: Insufficient documentation

## 2023-05-29 DIAGNOSIS — D508 Other iron deficiency anemias: Secondary | ICD-10-CM | POA: Diagnosis present

## 2023-05-29 DIAGNOSIS — Z807 Family history of other malignant neoplasms of lymphoid, hematopoietic and related tissues: Secondary | ICD-10-CM | POA: Insufficient documentation

## 2023-05-29 DIAGNOSIS — Z803 Family history of malignant neoplasm of breast: Secondary | ICD-10-CM | POA: Diagnosis not present

## 2023-05-29 DIAGNOSIS — Z79624 Long term (current) use of inhibitors of nucleotide synthesis: Secondary | ICD-10-CM | POA: Diagnosis not present

## 2023-05-29 DIAGNOSIS — E538 Deficiency of other specified B group vitamins: Secondary | ICD-10-CM | POA: Diagnosis present

## 2023-05-29 DIAGNOSIS — R5381 Other malaise: Secondary | ICD-10-CM | POA: Insufficient documentation

## 2023-05-29 LAB — IRON AND IRON BINDING CAPACITY (CC-WL,HP ONLY)
Iron: 95 ug/dL (ref 28–170)
Saturation Ratios: 28 % (ref 10.4–31.8)
TIBC: 340 ug/dL (ref 250–450)
UIBC: 245 ug/dL (ref 148–442)

## 2023-05-29 LAB — CBC WITH DIFFERENTIAL (CANCER CENTER ONLY)
Abs Immature Granulocytes: 0.02 10*3/uL (ref 0.00–0.07)
Basophils Absolute: 0 10*3/uL (ref 0.0–0.1)
Basophils Relative: 1 %
Eosinophils Absolute: 0.1 10*3/uL (ref 0.0–0.5)
Eosinophils Relative: 2 %
HCT: 37.8 % (ref 36.0–46.0)
Hemoglobin: 12.3 g/dL (ref 12.0–15.0)
Immature Granulocytes: 0 %
Lymphocytes Relative: 27 %
Lymphs Abs: 1.5 10*3/uL (ref 0.7–4.0)
MCH: 28.5 pg (ref 26.0–34.0)
MCHC: 32.5 g/dL (ref 30.0–36.0)
MCV: 87.5 fL (ref 80.0–100.0)
Monocytes Absolute: 0.5 10*3/uL (ref 0.1–1.0)
Monocytes Relative: 10 %
Neutro Abs: 3.2 10*3/uL (ref 1.7–7.7)
Neutrophils Relative %: 60 %
Platelet Count: 335 10*3/uL (ref 150–400)
RBC: 4.32 MIL/uL (ref 3.87–5.11)
RDW: 18.2 % — ABNORMAL HIGH (ref 11.5–15.5)
WBC Count: 5.4 10*3/uL (ref 4.0–10.5)
nRBC: 0 % (ref 0.0–0.2)

## 2023-05-29 LAB — VITAMIN B12: Vitamin B-12: 274 pg/mL (ref 180–914)

## 2023-05-29 LAB — SEDIMENTATION RATE: Sed Rate: 3 mm/h (ref 0–22)

## 2023-05-29 LAB — RETICULOCYTES
Immature Retic Fract: 10.2 % (ref 2.3–15.9)
RBC.: 4.28 MIL/uL (ref 3.87–5.11)
Retic Count, Absolute: 70.6 10*3/uL (ref 19.0–186.0)
Retic Ct Pct: 1.7 % (ref 0.4–3.1)

## 2023-05-29 LAB — FERRITIN: Ferritin: 262 ng/mL (ref 11–307)

## 2023-05-29 LAB — ABO/RH: ABO/RH(D): O POS

## 2023-05-29 NOTE — Assessment & Plan Note (Signed)
 She complained of fatigue and appears deconditioned She also have diagnosis of multiple sclerosis I recommend the patient to increase protein intake and to start strength training I gave her resources to help her to accomplish increased protein intake and encouraged her to sign up to a new gym

## 2023-05-29 NOTE — Assessment & Plan Note (Signed)
She will continue high-dose vitamin D replacement therapy

## 2023-05-29 NOTE — Assessment & Plan Note (Signed)
 I have updated her family history I will reach out to genetic counselor to see if she would qualify for genetic testing

## 2023-05-29 NOTE — Assessment & Plan Note (Signed)
 She is known to have multiple mineral deficiencies including thiamine, zinc , B12 and iron  deficiencies I will recheck her vitamin levels I recommend the patient to start taking vitamin supplement in addition to her multivitamin I encouraged her to try to look for other formula of zinc  replacement therapy from a vitamin store She has not received B12 injection for a while and I will recheck a B12 level again She just completed the intravenous iron  infusion and I will recheck it again to make sure she does not need intravenous iron  replacement I will also add copper level to rule out copper deficiency

## 2023-05-29 NOTE — Progress Notes (Addendum)
 [facility] CONSULT NOTE  Patient Care Team: Arcadio Knuckles, MD as PCP - General (Internal Medicine) Reinaldo Caras, MD as Consulting Physician (Obstetrics and Gynecology)  ASSESSMENT & PLAN:  Gastric bypass status for obesity The patient had history of bariatric surgery, causing significant malabsorption and multiple mineral deficiencies Outside EGD from March 2025 report was reviewed which showed no evidence of ulceration or bleeding Continue supportive care  Deficiency anemia She is known to have multiple mineral deficiencies including thiamine, zinc , B12 and iron  deficiencies I will recheck her vitamin levels I recommend the patient to start taking vitamin supplement in addition to her multivitamin I encouraged her to try to look for other formula of zinc  replacement therapy from a vitamin store She has not received B12 injection for a while and I will recheck a B12 level again She just completed the intravenous iron  infusion and I will recheck it again to make sure she does not need intravenous iron  replacement I will also add copper level to rule out copper deficiency  Vitamin D  deficiency She will continue high-dose vitamin D  replacement therapy  Physical debility She complained of fatigue and appears deconditioned She also have diagnosis of multiple sclerosis I recommend the patient to increase protein intake and to start strength training I gave her resources to help her to accomplish increased protein intake and encouraged her to sign up to a new gym  Family history of cancer I have updated her family history I will reach out to genetic counselor to see if she would qualify for genetic testing Orders Placed This Encounter  Procedures   Ferritin    Standing Status:   Future    Number of Occurrences:   1    Expiration Date:   05/28/2024   Iron  and Iron  Binding Capacity ([facility] only)    Standing Status:   Future    Number of Occurrences:   1     Expiration Date:   05/28/2024   Vitamin B12    Standing Status:   Future    Number of Occurrences:   1    Expiration Date:   05/28/2024   Copper, serum    Standing Status:   Future    Number of Occurrences:   1    Expiration Date:   05/28/2024   CBC with Differential (Cancer Center Only)    Standing Status:   Future    Number of Occurrences:   1    Expiration Date:   05/28/2024   Sedimentation rate    Standing Status:   Future    Number of Occurrences:   1    Expiration Date:   05/28/2024   Reticulocytes    Standing Status:   Future    Number of Occurrences:   1    Expiration Date:   05/28/2024   Ambulatory referral to Genetics    Referral Priority:   Routine    Referral Type:   Consultation    Referral Reason:   Specialty Services Required    Number of Visits Requested:   1   ABO/Rh    Standing Status:   Future    Number of Occurrences:   1    Expiration Date:   05/28/2024    All questions were answered. The patient knows to call the clinic with any problems, questions or concerns.  The total time spent in the appointment was 60 minutes encounter with patients including review of chart and various tests results,  discussions about plan of care and coordination of care plan  Almeda Jacobs, MD 5/23/20252:48 PM   CHIEF COMPLAINTS/PURPOSE OF CONSULTATION:  Anemia with multiple mineral deficiencies  HISTORY OF PRESENTING ILLNESS:  Sabrina Owen 48 y.o. female is here because of anemia  She was found to have abnormal CBC from recent blood work The patient had sleeve gastrectomy in 2013 and gastric bypass surgery in 2021 Her original weight was over 300 pounds.  Prior to her last surgery, she weighed over 200 pounds She is currently maintaining around 175 pounds She is known to have multiple mineral deficiencies including vitamin D  deficiency, zinc  deficiency, iron  deficiency, vitamin B12 deficiency and thiamine deficiency She has not been taking zinc  supplement consistently because  it hurt her stomach She underwent EGD evaluation in March of which I have reviewed which show no evidence of ulceration She is not taking separate thiamine supplement.  She has received vitamin B12 injection last year She just completed 3 different courses of intravenous iron  infusion recently She complained of chronic fatigue She also have diagnosis of multiple sclerosis and has been receiving treatment at [facility] She was supposed to undergo hysterectomy for fibroid removal but surgery was canceled due to recent infusion for multiple sclerosis She has concern for cancer due to multiple family history of malignancies  She denies recent chest pain on exertion, shortness of breath on minimal exertion, pre-syncopal episodes, or palpitations. She had not noticed any recent bleeding such as epistaxis, hematuria or hematochezia The patient denies over the counter NSAID ingestion. She is not on antiplatelets agents.  She had no prior history or diagnosis of cancer. Her age appropriate screening programs are up-to-date. She denies any pica and eats a variety of diet. She never donated blood or received blood transfusion  MEDICAL HISTORY:  Past Medical History:  Diagnosis Date   Anemia    Lupus    MS (multiple sclerosis) (HCC)     SURGICAL HISTORY: Past Surgical History:  Procedure Laterality Date   CATARACT EXTRACTION     ENDOMETRIAL ABLATION     GASTRIC BYPASS     TUBAL LIGATION      SOCIAL HISTORY: Social History   Socioeconomic History   Marital status: Single    Spouse name: Not on file   Number of children: 2   Years of education: Not on file   Highest education level: Bachelor's degree (e.g., BA, AB, BS)  Occupational History   Not on file  Tobacco Use   Smoking status: Never   Smokeless tobacco: Never  Substance and Sexual Activity   Alcohol use: Not Currently    Comment: occ   Drug use: Never   Sexual activity: Yes    Partners: Male    Birth  control/protection: Surgical    Comment: ablation, btl  Other Topics Concern   Not on file  Social History Narrative   Not on file   Social Drivers of Health   Financial Resource Strain: Medium Risk (06/10/2022)   Overall Financial Resource Strain (CARDIA)    Difficulty of Paying Living Expenses: Somewhat hard  Food Insecurity: No Food Insecurity (06/10/2022)   Hunger Vital Sign    Worried About Running Out of Food in the Last Year: Never true    Ran Out of Food in the Last Year: Never true  Transportation Needs: No Transportation Needs (06/10/2022)   PRAPARE - Administrator, Civil Service (Medical): No    Lack of Transportation (Non-Medical): No  Physical  Activity: Unknown (06/10/2022)   Exercise Vital Sign    Days of Exercise per Week: 0 days    Minutes of Exercise per Session: Not on file  Stress: Stress Concern Present (06/10/2022)   [employer] of Occupational Health - Occupational Stress Questionnaire    Feeling of Stress : To some extent  Social Connections: Moderately Integrated (06/10/2022)   Social Connection and Isolation Panel [NHANES]    Frequency of Communication with Friends and Family: More than three times a week    Frequency of Social Gatherings with Friends and Family: More than three times a week    Attends Religious Services: 1 to 4 times per year    Active Member of [employer] or Organizations: No    Attends Banker Meetings: Not on file    Marital Status: Married  Intimate Partner Violence: Not At Risk (07/05/2019)   Received from [facility] visits prior to 03/08/2022., [facility] visits prior to 03/08/2022.   Humiliation, Afraid, Rape, and Kick questionnaire    Fear of Current or Ex-Partner: No    Emotionally Abused: No    Physically Abused: No    Sexually Abused: No    FAMILY HISTORY: Family History  Problem Relation Age of Onset   Hypertension Mother    Depression Mother    Cancer  Maternal Aunt        multiple myeloma   Cancer Maternal Aunt        breast ca, triple neg   Cancer Maternal Aunt        pancreatic ca   Cancer Maternal Uncle        lung cancer   Cancer Maternal Uncle        prostate ca   Cancer Maternal Grandmother        lung ca   Cancer Child        colon ca   Cancer Cousin        multiple myeloma   Cancer Cousin        prostate   Cancer Cousin        thyroid  cancer    ALLERGIES:  is allergic to topamax [topiramate].  MEDICATIONS:  Current Outpatient Medications  Medication Sig Dispense Refill   cholecalciferol (VITAMIN D3) 25 MCG (1000 UNIT) tablet Take 5,000 Units by mouth daily.     amphetamine-dextroamphetamine (ADDERALL XR) 20 MG 24 hr capsule Take 20 mg by mouth as needed.     Eszopiclone  3 MG TABS Take 1 tablet (3 mg total) by mouth at bedtime. Take immediately before bedtime 90 tablet 0   FLUoxetine (PROZAC) 20 MG capsule Take 3 capsules by mouth daily.     Multiple Vitamin (MULTI-VITAMIN) tablet Take 1 tablet by mouth daily.     norethindrone  (ORTHO MICRONOR ) 0.35 MG tablet Take 1 tablet (0.35 mg total) by mouth daily. 84 tablet 3   triamterene -hydrochlorothiazide (DYAZIDE) 37.5-25 MG capsule Take 1 each (1 capsule total) by mouth daily. 90 capsule 0   valACYclovir (VALTREX) 500 MG tablet Take 1 tablet by mouth daily.     No current facility-administered medications for this visit.    REVIEW OF SYSTEMS:   Constitutional: Denies fevers, chills or abnormal night sweats Eyes: Denies blurriness of vision, double vision or watery eyes Ears, nose, mouth, throat, and face: Denies mucositis or sore throat Respiratory: Denies cough, dyspnea or wheezes Cardiovascular: Denies palpitation, chest discomfort or lower extremity swelling Gastrointestinal:  Denies nausea, heartburn or change  in bowel habits Skin: Denies abnormal skin rashes Lymphatics: Denies new lymphadenopathy or easy bruising Neurological:Denies numbness, tingling or new  weaknesses Behavioral/Psych: Mood is stable, no new changes  All other systems were reviewed with the patient and are negative.  PHYSICAL EXAMINATION: ECOG PERFORMANCE STATUS: 1 - Symptomatic but completely ambulatory  There were no vitals filed for this visit. There were no vitals filed for this visit.  GENERAL:alert, no distress and comfortable SKIN: skin color, texture, turgor are normal, no rashes or significant lesions EYES: normal, conjunctiva are pink and non-injected, sclera clear OROPHARYNX:no exudate, no erythema and lips, buccal mucosa, and tongue normal  NECK: supple, thyroid  normal size, non-tender, without nodularity LYMPH:  no palpable lymphadenopathy in the cervical, axillary or inguinal LUNGS: clear to auscultation and percussion with normal breathing effort HEART: regular rate & rhythm and no murmurs and no lower extremity edema ABDOMEN:abdomen soft, non-tender and normal bowel sounds Musculoskeletal:no cyanosis of digits and no clubbing.  Noted uneven distribution with muscle wasting in her upper body PSYCH: alert & oriented x 3 with fluent speech NEURO: no focal motor/sensory deficits

## 2023-05-29 NOTE — Assessment & Plan Note (Signed)
 The patient had history of bariatric surgery, causing significant malabsorption and multiple mineral deficiencies Outside EGD from March 2025 report was reviewed which showed no evidence of ulceration or bleeding Continue supportive care

## 2023-05-29 NOTE — Addendum Note (Signed)
 Addended byMarton Sleeper, Petronella Shuford on: 05/29/2023 02:48 PM   Modules accepted: Orders

## 2023-05-31 LAB — COPPER, SERUM: Copper: 95 ug/dL (ref 80–158)

## 2023-06-02 ENCOUNTER — Other Ambulatory Visit: Payer: Self-pay | Admitting: Hematology and Oncology

## 2023-06-02 ENCOUNTER — Telehealth: Payer: Self-pay | Admitting: Hematology and Oncology

## 2023-06-02 ENCOUNTER — Encounter: Payer: Self-pay | Admitting: Hematology and Oncology

## 2023-06-02 DIAGNOSIS — D508 Other iron deficiency anemias: Secondary | ICD-10-CM

## 2023-06-02 DIAGNOSIS — E60 Dietary zinc deficiency: Secondary | ICD-10-CM | POA: Insufficient documentation

## 2023-06-02 DIAGNOSIS — E538 Deficiency of other specified B group vitamins: Secondary | ICD-10-CM | POA: Insufficient documentation

## 2023-06-02 NOTE — Telephone Encounter (Signed)
 I reviewed recent test results with the patient.  CBC is normal Iron  studies are great Vitamin B12 is on the lower end of normal and I recommend restarting vitamin B12 injection every 3 months and she agreed She is able to resume vitamin B1 and zinc  supplement Copper  level is adequate I will see her again in 3 months for further follow-up and addressed all her questions

## 2023-06-03 ENCOUNTER — Encounter: Payer: Self-pay | Admitting: Hematology and Oncology

## 2023-06-03 ENCOUNTER — Telehealth: Payer: Self-pay | Admitting: Internal Medicine

## 2023-06-03 NOTE — Progress Notes (Signed)
 Called Dr. Sandra Crouch office and spoke with scheduling. Nicolle want to get B12 injections with PCP. They will give message to Dr. Rochelle Chu nurse.

## 2023-06-03 NOTE — Telephone Encounter (Signed)
 Copied from CRM [id number]. Topic: Clinical - Medical Advice >> Jun 03, 2023 12:07 PM [place] A wrote: Reason for CRM: [facility] Dr. Marton Sleeper said patient should start every three months for B-12 Injection. Patient requested to have B-12 Injections at Primary

## 2023-06-05 ENCOUNTER — Telehealth: Payer: Self-pay | Admitting: Hematology and Oncology

## 2023-06-05 NOTE — Telephone Encounter (Signed)
 Is this okay?

## 2023-06-05 NOTE — Telephone Encounter (Signed)
 Unable to reach patient. LMTRC

## 2023-06-08 ENCOUNTER — Ambulatory Visit (INDEPENDENT_AMBULATORY_CARE_PROVIDER_SITE_OTHER)

## 2023-06-08 DIAGNOSIS — E538 Deficiency of other specified B group vitamins: Secondary | ICD-10-CM

## 2023-06-08 MED ORDER — CYANOCOBALAMIN 1000 MCG/ML IJ SOLN
1000.0000 ug | Freq: Once | INTRAMUSCULAR | Status: AC
  Administered 2023-06-08: 1000 ug via INTRAMUSCULAR

## 2023-06-08 NOTE — Progress Notes (Signed)
Pt here for monthly B12 injection per Dr. Yetta Barre  B12 given IM. and pt tolerated injection well.

## 2023-06-09 ENCOUNTER — Encounter: Admitting: Obstetrics and Gynecology

## 2023-06-12 ENCOUNTER — Ambulatory Visit

## 2023-06-17 NOTE — Telephone Encounter (Signed)
 LVM to return call.

## 2023-06-22 ENCOUNTER — Telehealth: Admitting: Family Medicine

## 2023-06-22 DIAGNOSIS — B379 Candidiasis, unspecified: Secondary | ICD-10-CM | POA: Diagnosis not present

## 2023-06-22 DIAGNOSIS — T3695XA Adverse effect of unspecified systemic antibiotic, initial encounter: Secondary | ICD-10-CM

## 2023-06-22 DIAGNOSIS — B9689 Other specified bacterial agents as the cause of diseases classified elsewhere: Secondary | ICD-10-CM

## 2023-06-22 DIAGNOSIS — J019 Acute sinusitis, unspecified: Secondary | ICD-10-CM | POA: Diagnosis not present

## 2023-06-22 MED ORDER — FLUCONAZOLE 150 MG PO TABS: 150.0000 mg | ORAL_TABLET | ORAL | 0 refills | Status: DC

## 2023-06-22 MED ORDER — LIDOCAINE VISCOUS HCL 2 % MT SOLN: 15.0000 mL | OROMUCOSAL | 0 refills | Status: DC | PRN

## 2023-06-22 MED ORDER — AMOXICILLIN-POT CLAVULANATE 875-125 MG PO TABS: 1.0000 | ORAL_TABLET | Freq: Two times a day (BID) | ORAL | 0 refills | Status: AC

## 2023-06-22 NOTE — Progress Notes (Signed)
 Virtual Visit Consent   Sabrina Owen, you are scheduled for a virtual visit with a [facility] provider today. Just as with appointments in the office, your consent must be obtained to participate. Your consent will be active for this visit and any virtual visit you may have with one of our providers in the next 365 days. If you have a MyChart account, a copy of this consent can be sent to you electronically.  As this is a virtual visit, video technology does not allow for your provider to perform a traditional examination. This may limit your provider's ability to fully assess your condition. If your provider identifies any concerns that need to be evaluated in person or the need to arrange testing (such as labs, EKG, etc.), we will make arrangements to do so. Although advances in technology are sophisticated, we cannot ensure that it will always work on either your end or our end. If the connection with a video visit is poor, the visit may have to be switched to a telephone visit. With either a video or telephone visit, we are not always able to ensure that we have a secure connection.  By engaging in this virtual visit, you consent to the provision of healthcare and authorize for your insurance to be billed (if applicable) for the services provided during this visit. Depending on your insurance coverage, you may receive a charge related to this service.  I need to obtain your verbal consent now. Are you willing to proceed with your visit today? Sabrina Owen has provided verbal consent on 06/22/2023 for a virtual visit (video or telephone). [name], NP  Date: 06/22/2023 11:20 AM   Virtual Visit via Video Note   I, [name], connected with  Sabrina Owen  ([id number], Jan 26, 1975) on 06/22/23 at 11:15 AM EDT by a video-enabled telemedicine application and verified that I am speaking with the correct person using two identifiers.  Location: Patient: Virtual Visit Location Patient:  Home Provider: Virtual Visit Location Provider: Home Office   I discussed the limitations of evaluation and management by telemedicine and the availability of in person appointments. The patient expressed understanding and agreed to proceed.    History of Present Illness: Sabrina Owen is a 48 y.o. who identifies as a female who was assigned female at birth, and is being seen today for sore throat  Onset was almost a week of symptoms that are worsening daily, started with sore throat.  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/2023   Hypertension 04/23/2023   Deficiency anemia 04/23/2023   Screening for colon cancer 04/23/2023   Psychophysiological insomnia 04/23/2023   Iron  deficiency anemia due to chronic blood loss 04/23/2023   Glaucoma suspect 10/29/2022   Stroke (HCC) 10/29/2022   Flu vaccine need 09/15/2022   Palpitations 06/12/2022   Screen for colon cancer 06/12/2022   Cervical cancer screening 06/12/2022   Encounter for general adult medical examination with abnormal findings 06/12/2022  Iron  deficiency anemia secondary to inadequate dietary iron  intake 06/12/2022   Anemia due to zinc  deficiency 11/24/2020   Anemia due to acquired thiamine deficiency 11/24/2020   Gastric bypass status for obesity 11/21/2020   MS (multiple sclerosis) (HCC)     Allergies:  Allergies  Allergen Reactions   Topamax [Topiramate]    Medications:   Current Outpatient Medications:    amphetamine-dextroamphetamine (ADDERALL XR) 20 MG 24 hr capsule, Take 20 mg by mouth as needed., Disp: , Rfl:    cholecalciferol (VITAMIN D3) 25 MCG (1000 UNIT) tablet, Take 5,000 Units by mouth daily., Disp: , Rfl:    Eszopiclone  3 MG TABS, Take 1 tablet (3 mg total) by mouth at bedtime. Take immediately before bedtime, Disp: 90 tablet, Rfl: 0   FLUoxetine (PROZAC) 20 MG capsule, Take 3 capsules by mouth daily., Disp: , Rfl:    Multiple Vitamin (MULTI-VITAMIN) tablet, Take 1 tablet by mouth daily., Disp: , Rfl:    norethindrone  (ORTHO MICRONOR ) 0.35 MG tablet, Take 1 tablet (0.35 mg total) by mouth daily., Disp: 84 tablet, Rfl: 3   triamterene -hydrochlorothiazide (DYAZIDE) 37.5-25 MG capsule, Take 1 each (1 capsule total) by mouth daily., Disp: 90 capsule, Rfl: 0   valACYclovir (VALTREX) 500 MG tablet, Take 1 tablet by mouth daily., Disp: , Rfl:   Observations/Objective: Patient is well-developed, well-nourished in no acute distress.  Resting comfortably  at home.  Head is normocephalic, atraumatic.  No labored breathing.  Speech is clear and coherent with logical content.  Patient is alert and oriented at baseline.    Assessment and Plan:   1. Acute bacterial sinusitis (Primary)  - amoxicillin -clavulanate (AUGMENTIN ) 875-125 MG tablet; Take 1 tablet by mouth 2 (two) times daily for 7 days.  Dispense: 14 tablet; Refill: 0 - lidocaine (XYLOCAINE) 2 % solution; Use as directed 15 mLs in the mouth or throat as needed for mouth pain.  Dispense: 100 mL; Refill: 0  2. Antibiotic-induced yeast infection  - fluconazole  (DIFLUCAN ) 150 MG tablet; Take 1 tablet (150 mg total) by mouth as directed. Repeat in 3 days as needed  Dispense: 2 tablet; Refill: 0   - Increased rest - Increasing Fluids - Acetaminophen  / ibuprofen as needed for fever/pain.  - Salt water gargling, chloraseptic spray and throat lozenges - Saline nasal spray if congestion or if nasal  passages feel dry. - Humidifying the air.     Reviewed side effects, risks and benefits of medication.    Patient acknowledged agreement and understanding of the plan.   Past Medical, Surgical, Social History, Allergies, and Medications have been Reviewed.      Follow Up Instructions: I discussed the assessment and treatment plan with the patient. The patient was provided an opportunity to ask questions and all were answered. The patient agreed with the plan and demonstrated an understanding of the instructions.  A copy of instructions were sent to the patient via MyChart unless otherwise noted below.    The patient was advised to call back or seek an in-person evaluation if the symptoms worsen or if the condition fails to improve as anticipated.    [name], NP

## 2023-06-22 NOTE — Patient Instructions (Signed)
 Sabrina Owen, thank you for joining Sabrina Pion, NP for today's virtual visit.  While this provider is not your primary care provider (PCP), if your PCP is located in our provider database this encounter information will be shared with them immediately following your visit.   A [facility] MyChart account gives you access to today's visit and all your visits, tests, and labs performed at [facility]  click here if you don't have a [facility] MyChart account or go to [email or web address]  Consent: (Patient) Sabrina Owen provided verbal consent for this virtual visit at the beginning of the encounter.  Current Medications:  Current Outpatient Medications:    amoxicillin -clavulanate (AUGMENTIN ) 875-125 MG tablet, Take 1 tablet by mouth 2 (two) times daily for 7 days., Disp: 14 tablet, Rfl: 0   fluconazole  (DIFLUCAN ) 150 MG tablet, Take 1 tablet (150 mg total) by mouth as directed. Repeat in 3 days as needed, Disp: 2 tablet, Rfl: 0   lidocaine (XYLOCAINE) 2 % solution, Use as directed 15 mLs in the mouth or throat as needed for mouth pain., Disp: 100 mL, Rfl: 0   amphetamine-dextroamphetamine (ADDERALL XR) 20 MG 24 hr capsule, Take 20 mg by mouth as needed., Disp: , Rfl:    cholecalciferol (VITAMIN D3) 25 MCG (1000 UNIT) tablet, Take 5,000 Units by mouth daily., Disp: , Rfl:    Eszopiclone  3 MG TABS, Take 1 tablet (3 mg total) by mouth at bedtime. Take immediately before bedtime, Disp: 90 tablet, Rfl: 0   FLUoxetine (PROZAC) 20 MG capsule, Take 3 capsules by mouth daily., Disp: , Rfl:    Multiple Vitamin (MULTI-VITAMIN) tablet, Take 1 tablet by mouth daily., Disp: , Rfl:    norethindrone  (ORTHO MICRONOR ) 0.35 MG tablet, Take 1 tablet (0.35 mg total) by mouth daily., Disp: 84 tablet, Rfl: 3   triamterene -hydrochlorothiazide (DYAZIDE) 37.5-25 MG capsule, Take 1 each (1 capsule total) by mouth daily., Disp: 90 capsule, Rfl: 0   valACYclovir (VALTREX) 500 MG tablet, Take 1 tablet  by mouth daily., Disp: , Rfl:    Medications ordered in this encounter:  Meds ordered this encounter  Medications   amoxicillin -clavulanate (AUGMENTIN ) 875-125 MG tablet    Sig: Take 1 tablet by mouth 2 (two) times daily for 7 days.    Dispense:  14 tablet    Refill:  0    Supervising Provider:   Corine Owen [[id number]]   fluconazole  (DIFLUCAN ) 150 MG tablet    Sig: Take 1 tablet (150 mg total) by mouth as directed. Repeat in 3 days as needed    Dispense:  2 tablet    Refill:  0    Supervising Provider:   LAMPTEY, Sabrina O [[id number]]   lidocaine (XYLOCAINE) 2 % solution    Sig: Use as directed 15 mLs in the mouth or throat as needed for mouth pain.    Dispense:  100 mL    Refill:  0    Supervising Provider:   Corine Owen [[id number]]     *If you need refills on other medications prior to your next appointment, please contact your pharmacy*  Follow-Up: Call back or seek an in-person evaluation if the symptoms worsen or if the condition fails to improve as anticipated.   Virtual Care [phone]  Other Instructions   - Increased rest - Increasing Fluids - Acetaminophen  / ibuprofen as needed for fever/pain.  - Salt water gargling, chloraseptic spray and throat lozenges - Saline nasal spray if congestion or if nasal  passages feel dry. - Humidifying the air.    If you have been instructed to have an in-person evaluation today at a local Urgent Care facility, please use the link below. It will take you to a list of all of our available [facility], including address, phone number and hours of operation. Please do not delay care.  [facility]  If you or a family member do not have a primary care provider, use the link below to schedule a visit and establish care. When you choose a [facility] primary care physician or advanced practice provider, you gain a long-term partner in health. Find a Primary Care Provider  Learn more about  [facility] in-office and virtual care options: [facility] - Get Care Now

## 2023-06-24 ENCOUNTER — Encounter: Payer: Self-pay | Admitting: Family Medicine

## 2023-06-25 ENCOUNTER — Encounter: Payer: Self-pay | Admitting: Genetic Counselor

## 2023-06-27 ENCOUNTER — Ambulatory Visit (HOSPITAL_BASED_OUTPATIENT_CLINIC_OR_DEPARTMENT_OTHER)
Admission: RE | Admit: 2023-06-27 | Discharge: 2023-06-27 | Disposition: A | Discharge status: Home / Self Care | Source: Ambulatory Visit | Attending: Internal Medicine | Admitting: Internal Medicine

## 2023-06-27 ENCOUNTER — Ambulatory Visit
Admission: EM | Admit: 2023-06-27 | Discharge: 2023-06-27 | Disposition: A | Discharge status: Home / Self Care | Attending: Internal Medicine | Admitting: Internal Medicine

## 2023-06-27 DIAGNOSIS — R0989 Other specified symptoms and signs involving the circulatory and respiratory systems: Secondary | ICD-10-CM | POA: Diagnosis not present

## 2023-06-27 DIAGNOSIS — R059 Cough, unspecified: Secondary | ICD-10-CM | POA: Diagnosis present

## 2023-06-27 DIAGNOSIS — H109 Unspecified conjunctivitis: Secondary | ICD-10-CM | POA: Diagnosis not present

## 2023-06-27 DIAGNOSIS — H7292 Unspecified perforation of tympanic membrane, left ear: Secondary | ICD-10-CM

## 2023-06-27 DIAGNOSIS — J22 Unspecified acute lower respiratory infection: Secondary | ICD-10-CM | POA: Diagnosis not present

## 2023-06-27 DIAGNOSIS — R051 Acute cough: Secondary | ICD-10-CM

## 2023-06-27 MED ORDER — PREDNISONE 20 MG PO TABS
40.0000 mg | ORAL_TABLET | Freq: Every day | ORAL | 0 refills | Status: DC
Start: 2023-06-27 — End: 2023-07-02

## 2023-06-27 MED ORDER — AZITHROMYCIN 250 MG PO TABS
ORAL_TABLET | ORAL | 0 refills | Status: DC
Start: 2023-06-27 — End: 2023-07-23

## 2023-06-27 MED ORDER — PROMETHAZINE-DM 6.25-15 MG/5ML PO SYRP: 5.0000 mL | ORAL_SOLUTION | Freq: Three times a day (TID) | ORAL | 0 refills | Status: DC | PRN

## 2023-06-27 MED ORDER — ERYTHROMYCIN 5 MG/GM OP OINT
TOPICAL_OINTMENT | OPHTHALMIC | 0 refills | Status: DC
Start: 2023-06-27 — End: 2023-07-23

## 2023-06-27 NOTE — Discharge Instructions (Addendum)
 Physical exam findings are concerning for a possible right upper lobe pneumonia especially given the persistent and worsening symptoms despite being on Augmentin .  We will send you for a chest x-ray today especially given the comorbidities that you have.  If this does show a pneumonia present then we will recommend that you follow-up in 2 to 3 weeks for a repeat chest x-ray to ensure that this is completely cleared.  For the perforated left tympanic membrane, this likely will need to follow-up with an ear nose and throat specialist to ensure adequate healing.  The eye symptoms are consistent with a bacterial conjunctivitis and we will treat this with antibiotics topically.  We will treat the presenting symptoms with the following: Azithromycin 250mg  Take 2 tablets today and the 1 tablet daily for 4 more days.  Take this along with finishing the Augmentin  for dual coverage for possible pneumonia. Prednisone 40 mg (2 tablets) once daily for 5 days. Take this in the morning.  This is a steroid to help with inflammation and pain. Promethazine DM 5 mL every 8 hours as needed for cough.  Use caution as this medication can cause drowsiness. Go to med [facility] today for a chest x-ray.  You do not need to be seen in the emergency department.  This is just an outpatient x-ray.  We will contact you with the results once they are available. Erythromycin ointment apply a small amount into the eye under the lower eyelid of both eyes nightly for 5 days. Contact [place] [facility] to schedule an appointment for evaluation of perforated left tympanic membrane.

## 2023-06-27 NOTE — ED Provider Notes (Signed)
 [facility]    CSN: [id number] Arrival date & time: 06/27/23  0919      History   Chief Complaint Chief Complaint  Patient presents with   Cough    HPI Sabrina Owen is a 48 y.o. female.   48 year old female who presents to urgent care with complaints of persistent cough, chest tightness, sore throat, fevers, chills and now with eye redness and eye pain and eye discharge.  She reports that her symptoms actually started almost 3 weeks ago.  After a week of symptoms she did do a virtual visit.  Right before she did the virtual visit on 6/16 she noticed her ear on the left ruptured and started draining.  During the virtual visit she was prescribed Augmentin  for 7 days and lidocaine for her sore throat.  She had some Tessalon  at home that she was using for cough.  She reports that her symptoms have not improved and have gotten worse especially the cough and chest tightness.  She also reports she continues to have drainage from her left ear.  She is having intermittent fevers and chills.  In the last couple of days she has started to develop redness and discharge from both eyes.  She reports that they are caking up especially in the morning.  She was concerned because she is on immunosuppressive medication for her multiple sclerosis and lupus.    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   Hypertension 04/23/2023   Deficiency anemia 04/23/2023   Screening for colon cancer  04/23/2023   Psychophysiological insomnia 04/23/2023   Iron  deficiency anemia due to chronic blood loss 04/23/2023   Glaucoma suspect 10/29/2022   Stroke (HCC) 10/29/2022   Flu vaccine need 09/15/2022   Palpitations 06/12/2022   Screen for colon cancer 06/12/2022   Cervical cancer screening 06/12/2022   Encounter for general adult medical examination with abnormal findings 06/12/2022   Iron  deficiency anemia secondary to inadequate dietary iron  intake 06/12/2022   Anemia due to zinc  deficiency 11/24/2020   Anemia due to acquired thiamine deficiency 11/24/2020   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 Authorizing Provider  azithromycin (ZITHROMAX) 250 MG tablet Take first 2 tablets together, then 1 every day until finished. 06/27/23  Yes Davied Nocito A, PA-C  erythromycin ophthalmic ointment Place a 1/2 inch ribbon of ointment into the lower eyelid of both eyes nightly for 5 days. 06/27/23  Yes Leith Szafranski A, PA-C  predniSONE (DELTASONE) 20 MG tablet Take 2 tablets (40 mg total) by mouth daily with breakfast for 5 days. 06/27/23 07/02/23 Yes Breane Grunwald A, PA-C  promethazine-dextromethorphan (PROMETHAZINE-DM) 6.25-15 MG/5ML syrup Take 5 mLs by mouth every 8 (eight) hours as needed for cough. 06/27/23  Yes Yarelis Ambrosino A, PA-C  amoxicillin -clavulanate (AUGMENTIN ) 875-125 MG tablet Take 1 tablet by mouth 2 (two) times daily for 7 days. 06/22/23 06/29/23  Moishe Chiquita HERO, NP  amphetamine-dextroamphetamine (ADDERALL XR)  20 MG 24 hr capsule Take 20 mg by mouth as needed. 04/07/23   [provider]  cholecalciferol (VITAMIN D3) 25 MCG (1000 UNIT) tablet Take 5,000 Units by mouth daily.    [provider]  Eszopiclone  3 MG TABS Take 1 tablet (3 mg total) by mouth at bedtime. Take immediately before bedtime 04/23/23   Joshua Debby CROME, MD  fluconazole  (DIFLUCAN ) 150 MG tablet Take 1 tablet (150 mg total) by mouth as directed. Repeat in 3 days as needed 06/22/23   Moishe Chiquita HERO, NP  FLUoxetine (PROZAC) 20 MG capsule Take 3 capsules by mouth daily. 01/13/13   [provider]  lidocaine (XYLOCAINE) 2 % solution Use as directed 15 mLs in the mouth or throat as needed for mouth pain. 06/22/23   Moishe Chiquita HERO, NP  Multiple Vitamin (MULTI-VITAMIN) tablet Take 1 tablet by mouth daily.    [provider]  norethindrone  (ORTHO MICRONOR ) 0.35 MG tablet Take 1 tablet (0.35 mg total) by mouth daily. 04/01/23   Glennon Almarie POUR, MD  triamterene -hydrochlorothiazide (DYAZIDE) 37.5-25 MG capsule Take 1 each (1 capsule total) by mouth daily. 04/23/23   Joshua Debby CROME, MD  valACYclovir (VALTREX) 500 MG tablet Take 1 tablet by mouth daily. 08/29/12   [provider]    Family History Family History  Problem Relation Age of Onset   Hypertension Mother    Depression Mother    Cancer Maternal Aunt        multiple myeloma   Cancer Maternal Aunt        breast ca, triple neg   Cancer Maternal Aunt        pancreatic ca   Cancer Maternal Uncle        lung cancer   Cancer Maternal Uncle        prostate ca   Cancer Maternal Grandmother        lung ca   Cancer Child        colon ca   Cancer Cousin        multiple myeloma   Cancer Cousin        prostate   Cancer Cousin        thyroid  cancer    Social History Social History   Tobacco Use   Smoking status: Never   Smokeless tobacco: Never  Vaping Use   Vaping status: Never Used  Substance Use Topics   Alcohol use: Yes     Comment: occ   Drug use: Never     Allergies   Topamax [topiramate]   Review of Systems Review of Systems  Constitutional:  Positive for appetite change, chills and fever.  HENT:  Positive  for congestion, ear pain and sore throat.   Eyes:  Positive for discharge and redness. Negative for pain and visual disturbance.  Respiratory:  Positive for cough and chest tightness. Negative for shortness of breath.   Cardiovascular:  Negative for chest pain and palpitations.  Gastrointestinal:  Positive for diarrhea. Negative for abdominal pain and vomiting.  Genitourinary:  Negative for dysuria and hematuria.  Musculoskeletal:  Negative for arthralgias and back pain.  Skin:  Negative for color change and rash.  Neurological:  Negative for seizures and syncope.  All other systems reviewed and are negative.    Physical Exam Triage Vital Signs ED Triage Vitals  Encounter Vitals Group     BP 06/27/23 0939 131/81     Girls Systolic BP Percentile --      Girls Diastolic BP Percentile --      Boys Systolic BP Percentile --      Boys Diastolic BP Percentile --      Pulse Rate 06/27/23 0939 76     Resp 06/27/23 0939 16     Temp 06/27/23 0939 98.2 F (36.8 C)     Temp Source 06/27/23 0939 Oral     SpO2 06/27/23 0939 98 %     Weight --      Height --      Head Circumference --      Peak Flow --      Pain Score 06/27/23 0938 6     Pain Loc --      Pain Education --      Exclude from Growth Chart --    No data found.  Updated Vital Signs BP 131/81 (BP Location: Right Arm)   Pulse 76   Temp 98.2 F (36.8 C) (Oral)   Resp 16   SpO2 98%   Visual Acuity Right Eye Distance:   Left Eye Distance:   Bilateral Distance:    Right Eye Near:   Left Eye Near:    Bilateral Near:     Physical Exam Vitals and nursing note reviewed.  Constitutional:      General: She is not in acute distress.    Appearance: She is well-developed.  HENT:     Head: Normocephalic and atraumatic.      Right Ear: Tympanic membrane normal. Tympanic membrane is not erythematous.     Left Ear: Ear canal normal. Tympanic membrane is perforated. Tympanic membrane is not erythematous.     Ears:     Comments: Approximate 1/4 of size of TM perforation     Nose: Mucosal edema and congestion present.   Eyes:     Conjunctiva/sclera:     Right eye: Right conjunctiva is injected. Exudate present.     Left eye: Left conjunctiva is injected. Exudate present.    Cardiovascular:     Rate and Rhythm: Normal rate and regular rhythm.     Heart sounds: No murmur heard. Pulmonary:     Effort: Pulmonary effort is normal. No tachypnea or respiratory distress.     Breath sounds: Examination of the right-upper field reveals decreased breath sounds. Decreased breath sounds present. No wheezing or rhonchi.  Abdominal:     Palpations: Abdomen is soft.     Tenderness: There is no abdominal tenderness.   Musculoskeletal:        General: No swelling.     Cervical back: Neck supple.   Skin:    General: Skin is warm and dry.     Capillary Refill: Capillary refill takes  less than 2 seconds.   Neurological:     Mental Status: She is alert.   Psychiatric:        Mood and Affect: Mood normal.      UC Treatments / Results  Labs (all labs ordered are listed, but only abnormal results are displayed) Labs Reviewed - No data to display  EKG   Radiology No results found.  Procedures Procedures (including critical care time)  Medications Ordered in UC Medications - No data to display  Initial Impression / Assessment and Plan / UC Course  I have reviewed the triage vital signs and the nursing notes.  Pertinent labs & imaging results that were available during my care of the patient were reviewed by me and considered in my medical decision making (see chart for details).     Acute cough  Bacterial conjunctivitis  Perforation of left tympanic membrane  Lower respiratory infection   Physical  exam findings are concerning for a possible right upper lobe pneumonia especially given the persistent and worsening symptoms despite being on Augmentin .  We will send you for a chest x-ray today especially given the comorbidities that you have.  If this does show a pneumonia present then we will recommend that you follow-up in 2 to 3 weeks for a repeat chest x-ray to ensure that this is completely cleared.  For the perforated left tympanic membrane, this likely will need to follow-up with an ear nose and throat specialist to ensure adequate healing.  We will treat the presenting symptoms with the following: Azithromycin 250mg  Take 2 tablets today and the 1 tablet daily for 4 more days.  Take this along with finishing the Augmentin  for dual coverage for possible pneumonia. Prednisone 40 mg (2 tablets) once daily for 5 days. Take this in the morning.  This is a steroid to help with inflammation and pain. Promethazine DM 5 mL every 8 hours as needed for cough.  Use caution as this medication can cause drowsiness. Go to med [facility] today for a chest x-ray.  You do not need to be seen in the emergency department.  This is just an outpatient x-ray.  We will contact you with the results once they are available. Erythromycin ointment apply a small amount into the eye under the lower eyelid of both eyes nightly for 5 days. Contact [place] [facility] to schedule an appointment for evaluation of perforated left tympanic membrane.  Final Clinical Impressions(s) / UC Diagnoses   Final diagnoses:  Acute cough  Bacterial conjunctivitis  Perforation of left tympanic membrane  Lower respiratory infection     Discharge Instructions      Physical exam findings are concerning for a possible right upper lobe pneumonia especially given the persistent and worsening symptoms despite being on Augmentin .  We will send you for a chest x-ray today especially given the comorbidities that you have.   If this does show a pneumonia present then we will recommend that you follow-up in 2 to 3 weeks for a repeat chest x-ray to ensure that this is completely cleared.  For the perforated left tympanic membrane, this likely will need to follow-up with an ear nose and throat specialist to ensure adequate healing.  The eye symptoms are consistent with a bacterial conjunctivitis and we will treat this with antibiotics topically.  We will treat the presenting symptoms with the following: Azithromycin 250mg  Take 2 tablets today and the 1 tablet daily for 4 more days.  Take this along with finishing the Augmentin  for dual coverage for possible pneumonia. Prednisone 40 mg (2 tablets) once daily for 5 days. Take this in the morning.  This is a steroid to help with inflammation and pain. Promethazine DM 5 mL every 8 hours as needed for cough.  Use caution as this medication can cause drowsiness. Go to med [facility] today for a chest x-ray.  You do not need to be seen in the emergency department.  This is just an outpatient x-ray.  We will contact you with the results once they are available. Erythromycin ointment apply a small amount into the eye under the lower eyelid of both eyes nightly for 5 days. Contact [place] [facility] to schedule an appointment for evaluation of perforated left tympanic membrane.     ED Prescriptions     Medication Sig Dispense Auth. Provider   predniSONE (DELTASONE) 20 MG tablet Take 2 tablets (40 mg total) by mouth daily with breakfast for 5 days. 10 tablet Teresa Almarie LABOR, PA-C   promethazine-dextromethorphan (PROMETHAZINE-DM) 6.25-15 MG/5ML syrup Take 5 mLs by mouth every 8 (eight) hours as needed for cough. 180 mL Tamella Tuccillo A, PA-C   azithromycin (ZITHROMAX) 250 MG tablet Take first 2 tablets together, then 1 every day until finished. 6 tablet Kamren Heintzelman A, PA-C   erythromycin ophthalmic ointment Place a 1/2 inch ribbon of ointment into the  lower eyelid of both eyes nightly for 5 days. 3.5 g Teresa Almarie LABOR, [place]      PDMP not reviewed this encounter.   Teresa Almarie LABOR, PA-C 06/27/23 1114

## 2023-06-27 NOTE — ED Triage Notes (Signed)
 Pt c/o cough, bilat pink eye, sore throat and left ear pain-states she had video visit 6/16 and is taking abx-feels her cough is worse and the eye c/o started 2 days ago-NAD-steady gait

## 2023-06-29 ENCOUNTER — Encounter (INDEPENDENT_AMBULATORY_CARE_PROVIDER_SITE_OTHER): Payer: Self-pay

## 2023-07-06 ENCOUNTER — Encounter: Payer: Self-pay | Admitting: Family Medicine

## 2023-07-06 ENCOUNTER — Ambulatory Visit: Payer: Self-pay

## 2023-07-06 ENCOUNTER — Ambulatory Visit: Admitting: Family Medicine

## 2023-07-06 VITALS — BP 130/82 | HR 67 | Temp 98.1°F | Ht 68.0 in | Wt 174.0 lb

## 2023-07-06 DIAGNOSIS — Z79899 Other long term (current) drug therapy: Secondary | ICD-10-CM

## 2023-07-06 DIAGNOSIS — G35 Multiple sclerosis: Secondary | ICD-10-CM | POA: Diagnosis not present

## 2023-07-06 DIAGNOSIS — R052 Subacute cough: Secondary | ICD-10-CM

## 2023-07-06 DIAGNOSIS — D84821 Immunodeficiency due to drugs: Secondary | ICD-10-CM

## 2023-07-06 DIAGNOSIS — I10 Essential (primary) hypertension: Secondary | ICD-10-CM

## 2023-07-06 DIAGNOSIS — M328 Other forms of systemic lupus erythematosus: Secondary | ICD-10-CM

## 2023-07-06 DIAGNOSIS — H6992 Unspecified Eustachian tube disorder, left ear: Secondary | ICD-10-CM | POA: Diagnosis not present

## 2023-07-06 DIAGNOSIS — J40 Bronchitis, not specified as acute or chronic: Secondary | ICD-10-CM | POA: Insufficient documentation

## 2023-07-06 MED ORDER — PREDNISONE 20 MG PO TABS: 40.0000 mg | ORAL_TABLET | Freq: Every day | ORAL | 0 refills | Status: DC

## 2023-07-06 MED ORDER — HYDROCODONE BIT-HOMATROP MBR 5-1.5 MG/5ML PO SOLN: 5.0000 mL | Freq: Three times a day (TID) | ORAL | 0 refills | Status: DC | PRN

## 2023-07-06 MED ORDER — FLUCONAZOLE 150 MG PO TABS: 150.0000 mg | ORAL_TABLET | Freq: Once | ORAL | 2 refills | Status: DC

## 2023-07-06 NOTE — Telephone Encounter (Signed)
 FYI Only or Action Required?: Action required by provider: request for appointment and clinical question for provider.  Patient was last seen in primary care on 06/22/2023 by Moishe Chiquita HERO, NP. Called Nurse Triage reporting Otalgia and Eye Problem. Symptoms began a week ago. Interventions attempted: Nothing. Symptoms are: gradually worsening.  Triage Disposition: See Physician Within 24 Hours  Patient/caregiver understands and will follow disposition?: Yes, will follow disposition  Copied from CRM [id number]. Topic: Clinical - Red Word Triage >> Jul 06, 2023 11:22 AM Sabrina Owen wrote: Reason for [id number] called stating she went to urgent care on 6/21and she was seen for a ruptured ear drum, double pink eye and sore throat. Patient stated she was given steroids, antibotics and cough syrup. Patient stated she is still having the ear pain and sore throat after taking the medication Reason for Disposition  Earache  (Exceptions: brief ear pain of < 60 minutes duration, earache occurring during air travel  Answer Assessment - Initial Assessment Questions 1. LOCATION: Which ear is involved?     L ear 2. ONSET: When did the ear start hurting      Weeks ago 3. SEVERITY: How bad is the pain?  (Scale 1-10; mild, moderate or severe)   - MILD (1-3): doesn't interfere with normal activities    - MODERATE (4-7): interferes with normal activities or awakens from sleep    - SEVERE (8-10): excruciating pain, unable to do any normal activities      5 4. URI SYMPTOMS: Do you have a runny nose or cough?     Runny nose, cough 5. FEVER: Do you have a fever? If Yes, ask: What is your temperature, how was it measured, and when did it start?     denies 6. CAUSE: Have you been swimming recently?, How often do you use Q-TIPS?, Have you had any recent air travel or scuba diving?     denies 7. OTHER SYMPTOMS: Do you have any other symptoms? (e.g., headache, stiff neck, dizziness, vomiting, runny  nose, decreased hearing)     Dizziness  Pt does not want to be scheduled at this time, has ENT appt on 7/2, would like DR Joshua to review if she should be on abx before the ENT.  Protocols used: [name]

## 2023-07-06 NOTE — Telephone Encounter (Signed)
 Patient has been scheduled

## 2023-07-06 NOTE — Progress Notes (Signed)
 Acute Office Visit  Subjective:     Patient ID: Sabrina Owen, female    DOB: [birth year], 48 y.o.   MRN: [id number]  Chief Complaint  Patient presents with   Acute Visit    Has perforated ear drum, sees ENT on Wednesday. Was put on antibiotics on 06/21, once finished symptoms came back. Complains of dry cough    HPI Patient is in today for evaluation of continued cough after sinus and ear infection. Has perforated eardrum in the left ear, sees ENT on Wednesday. States that she was put on antibiotics on 06/27/2023, and once they were completed symptoms returned. Reports persistent dry cough, no fever, no abdominal pain, no nausea, no vomiting, no diarrhea, no constipation, no rash, no purulent nasal discharge, no other symptoms. Has not attempted OTC treatment. Of note, has history of MS and lupus. Denies other concerns today. Medical history as outlined below.    ROS Per HPI      Objective:    BP 130/82 (BP Location: Left Arm, Patient Position: Sitting)   Pulse 67   Temp 98.1 F (36.7 C) (Temporal)   Ht 5' 8 (1.727 m)   Wt 174 lb (78.9 kg)   SpO2 98%   BMI 26.46 kg/m    Physical Exam Vitals and nursing note reviewed.  Constitutional:      General: She is not in acute distress.    Appearance: Normal appearance.     Comments: Appears fatigued  HENT:     Head: Normocephalic and atraumatic.     Right Ear: External ear normal.     Left Ear: External ear normal. Tympanic membrane is perforated.     Nose: Nose normal.     Mouth/Throat:     Mouth: Mucous membranes are moist.     Pharynx: Oropharynx is clear.  Eyes:     Extraocular Movements: Extraocular movements intact.     Pupils: Pupils are equal, round, and reactive to light.  Cardiovascular:     Rate and Rhythm: Normal rate and regular rhythm.     Pulses: Normal pulses.     Heart sounds: Normal heart sounds.  Pulmonary:     Effort: Pulmonary effort is normal. No respiratory distress.     Breath sounds:  Normal breath sounds. No wheezing, rhonchi or rales.     Comments: Persistent dry cough Musculoskeletal:        General: Normal range of motion.     Cervical back: Normal range of motion.     Right lower leg: No edema.     Left lower leg: No edema.  Lymphadenopathy:     Cervical: No cervical adenopathy.  Neurological:     General: No focal deficit present.     Mental Status: She is alert and oriented to person, place, and time.  Psychiatric:        Mood and Affect: Mood normal.        Thought Content: Thought content normal.     No results found for any visits on 07/06/23.      Assessment & Plan:   Subacute cough Assessment & Plan: Hycodan to pharmacy  Orders: -     HYDROcodone  Bit-Homatrop MBr; Take 5 mLs by mouth every 8 (eight) hours as needed for cough.  Dispense: 120 mL; Refill: 0 -     Fluconazole ; Take 1 tablet (150 mg total) by mouth once for 1 dose.  Dispense: 2 tablet; Refill: 2  MS (multiple sclerosis) (HCC) Assessment & Plan: Likely  contributing to extended course of illness   Primary hypertension Assessment & Plan: Controlled, continue current medication regimen   Acute dysfunction of left eustachian tube Assessment & Plan: Discussed eustachian tube massage Discussed the use of nasal sprays Follow-up with ENT as scheduled   Bronchitis Assessment & Plan: Will send in prednisone , will likely extend course given immunocompromise due to MS and lupus treatment Hycodan to pharmacy Fluconazole  to pharmacy, increased risk of yeast given recent antibiotic use and extended steroid use, immunocompromise  Orders: -     Fluconazole ; Take 1 tablet (150 mg total) by mouth once for 1 dose.  Dispense: 2 tablet; Refill: 2  Other forms of systemic lupus erythematosus, unspecified organ involvement status (HCC) Assessment & Plan: Likely contributing to extended course of illness   Immunocompromised state due to drug therapy [facility]) Assessment & Plan: Likely  contributing to extended course of illness      Meds ordered this encounter  Medications   HYDROcodone  bit-homatropine (HYCODAN) 5-1.5 MG/5ML syrup    Sig: Take 5 mLs by mouth every 8 (eight) hours as needed for cough.    Dispense:  120 mL    Refill:  0   DISCONTD: predniSONE  (DELTASONE ) 20 MG tablet    Sig: Take 2 tablets (40 mg total) by mouth daily with breakfast for 15 days. Take 1 tab for 5 days, then take 1/2 tab for 5 day    Dispense:  20 tablet    Refill:  0   fluconazole  (DIFLUCAN ) 150 MG tablet    Sig: Take 1 tablet (150 mg total) by mouth once for 1 dose.    Dispense:  2 tablet    Refill:  2    Return if symptoms worsen or fail to improve.  Corean LITTIE Ku, FNP

## 2023-07-07 ENCOUNTER — Encounter: Payer: Self-pay | Admitting: Family Medicine

## 2023-07-07 DIAGNOSIS — M329 Systemic lupus erythematosus, unspecified: Secondary | ICD-10-CM

## 2023-07-07 DIAGNOSIS — G35 Multiple sclerosis: Secondary | ICD-10-CM

## 2023-07-07 DIAGNOSIS — R052 Subacute cough: Secondary | ICD-10-CM

## 2023-07-07 MED ORDER — PREDNISONE 20 MG PO TABS: ORAL_TABLET | ORAL | 0 refills | Status: DC

## 2023-07-08 ENCOUNTER — Encounter (INDEPENDENT_AMBULATORY_CARE_PROVIDER_SITE_OTHER): Payer: Self-pay | Admitting: Otolaryngology

## 2023-07-08 ENCOUNTER — Ambulatory Visit (INDEPENDENT_AMBULATORY_CARE_PROVIDER_SITE_OTHER): Admitting: Otolaryngology

## 2023-07-08 VITALS — BP 131/84 | HR 78 | Ht 68.0 in | Wt 174.0 lb

## 2023-07-08 DIAGNOSIS — R0981 Nasal congestion: Secondary | ICD-10-CM | POA: Diagnosis not present

## 2023-07-08 DIAGNOSIS — H9190 Unspecified hearing loss, unspecified ear: Secondary | ICD-10-CM

## 2023-07-08 DIAGNOSIS — H6692 Otitis media, unspecified, left ear: Secondary | ICD-10-CM

## 2023-07-08 DIAGNOSIS — H6993 Unspecified Eustachian tube disorder, bilateral: Secondary | ICD-10-CM | POA: Diagnosis not present

## 2023-07-08 DIAGNOSIS — H7292 Unspecified perforation of tympanic membrane, left ear: Secondary | ICD-10-CM

## 2023-07-08 MED ORDER — AZELASTINE HCL 0.1 % NA SOLN: 2.0000 | Freq: Two times a day (BID) | NASAL | 12 refills | Status: AC

## 2023-07-08 MED ORDER — OFLOXACIN 0.3 % OT SOLN: 4.0000 [drp] | Freq: Two times a day (BID) | OTIC | 1 refills | Status: AC

## 2023-07-08 MED ORDER — FLONASE SENSIMIST 27.5 MCG/SPRAY NA SUSP: 2.0000 | Freq: Two times a day (BID) | NASAL | 12 refills | Status: AC

## 2023-07-08 NOTE — Patient Instructions (Signed)
 Use ofloxacin drops 4 drops left ear twice daily for 7 days; try to keep water out of the ear -- wear a cotton ball coated in vaseline when you shower.   Use two sprays of flonase  in each nostril twice per day  right after, use astelin spray two sprays each nostril twice per day  [facility] Nasal Saline Rinse - use daily - start nasal saline rinses with [facility] Bottle available over the counter    Nasal Saline Irrigation instructions: If you choose to make your own salt water solution, You will need: Salt (kosher, canning, or pickling salt) Baking soda Nasal irrigation bottle (i.e. [facility] Med Sinus Rinse) Measuring spoon ( teaspoon) Distilled / boiled water   Mix solution Mix 1 teaspoon of salt, 1/2 teaspoon of baking soda and 1 cup of water into irrigation bottle ** May use saline packet instead of homemade recipe for this step if you prefer If medicine was prescribed to be mixed with solution, place this into bottle Examples 2 inches of 2% mupirocin ointment Budesonide solution Position your head: Lean over sink (about 45 degrees) Rotate head (about 45 degrees) so that one nostril is above the other Irrigate Insert tip of irrigation bottle into upper nostril so it forms a comfortable seal Irrigate while breathing through your mouth May remove the straw from the bottle in order to irrigate the entire solution (important if medicine was added) Exhale through nose when finished and blow nose as necessary  Repeat on opposite side with other 1/2 of solution (120 mL) or remake solution if all 240 mL was used on first side Wash irrigation bottle regularly, replace every 3 months

## 2023-07-08 NOTE — Progress Notes (Signed)
 Dear Dr. Joshua, Here is my assessment for our mutual patient, Sabrina Owen. Thank you for allowing me the opportunity to care for your patient. Please do not hesitate to contact me should you have any other questions. Sincerely, Dr. Eldora Blanch  Otolaryngology Clinic Note Referring provider: Dr. Joshua HPI:  Sabrina Owen is a 48 y.o. female kindly referred by Dr. Joshua for evaluation of tympanic membrane perforation  Initial visit (07/2023): Patient reports: she was noted to have sinusitis/URI in June 2025 with subsequent left ear pain and drainage. She was prescribed augmentin  with improvement in symptoms. No ear drops. She was then noted to have a left tympanic membrane perforation and referred here for otologic symptoms and perforation. Of note, she does not that she was told by her family that she has had a hole in her eardrum since she was a child - unclear how she got it. Denies history of frequent ear infections as adult, does keep ear dry. Drainage has resolved. Denies chronic drainage. She does have MS and is on pred for cough and ETD as well as flonase . Patient currently denies: ear pain, fullness, vertigo, drainage, tinnitus Patient additionally denies: deep pain in ear canal, eustachian tube symptoms such as popping, crackling, sensitive to pressure changes Patient also denies barotrauma, vestibular suppressant use, ototoxic medication use Prior ear surgery: no per patient Denies frequent sinusitis episodes but does report some current congestion and PND.  Personal or FHx of bleeding dz or anesthesia difficulty: no  GLP-1: no AP/AC: no  Tobacco: no  PMHx: MS, SLE(?), Uveitis  Independent Review of Additional Tests or Records:  Sabrina Owen (06/22/2023): sore throat, nasal symptoms, some left ear pain and drainage. Dx: bacterial sinusitis; Rx: augmentin ; noted improvement 06/24/2023. UC visit Sabrina Owen (06/27/2023): noted cough, sore throat, eye symptoms; noted prior left  ear TM rupture and drainage; sx not improving; continued left ear drainage; Dx: possible PNA; Rx: azithromycin , prednisone , ref to ENT Labs: CBC, Sed rate, CMP 05/29/2023: WBC 4.2, BUN/Cr wnl; ESR wnl MRI Brain 03/22/2022 independently interpreted with respect to ears: brain findings as per read; no mastoid or ME effusion noted; no retrocochlear lesions noted but no dedicated IAC cuts PMH/Meds/All/SocHx/FamHx/ROS:   Past Medical History:  Diagnosis Date   Allergy    Topamax   Anemia    Anxiety 1995   Cataract 2009   Depression 1995   GERD (gastroesophageal reflux disease)    Lupus    MS (multiple sclerosis) (HCC)    Stroke (HCC) 2007   After birth of second child     Past Surgical History:  Procedure Laterality Date   CATARACT EXTRACTION     ENDOMETRIAL ABLATION     EYE SURGERY     GASTRIC BYPASS     TUBAL LIGATION      Family History  Problem Relation Age of Onset   Hypertension Mother    Depression Mother    Obesity Mother    Hypertension Father    Cancer Maternal Aunt        multiple myeloma   Cancer Maternal Aunt        breast ca, triple neg   Cancer Maternal Aunt        pancreatic ca   Cancer Maternal Uncle        lung cancer   Cancer Maternal Uncle        prostate ca   Cancer Maternal Grandmother        lung ca   Depression Maternal Grandmother  Cancer Child        colon ca   Cancer Cousin        multiple myeloma   Cancer Cousin        prostate   Cancer Cousin        thyroid  cancer   Diabetes Maternal Grandfather    Cancer Maternal Aunt    Hypertension Maternal Aunt    Cancer Maternal Aunt    Hypertension Maternal Aunt    Obesity Maternal Aunt    Cancer Maternal Aunt    Cancer Maternal Uncle    Cancer Maternal Uncle      Social Connections: Moderately Integrated (06/10/2022)   Social Connection and Isolation Panel    Frequency of Communication with Friends and Family: More than three times a week    Frequency of Social Gatherings with Friends  and Family: More than three times a week    Attends Religious Services: 1 to 4 times per year    Active Member of [employer] or Organizations: No    Attends Engineer, structural: Not on file    Marital Status: Married      Current Outpatient Medications:    amphetamine-dextroamphetamine (ADDERALL XR) 20 MG 24 hr capsule, Take 20 mg by mouth as needed., Disp: , Rfl:    azelastine  (ASTELIN ) 0.1 % nasal spray, Place 2 sprays into both nostrils 2 (two) times daily. Use in each nostril as directed, Disp: 30 mL, Rfl: 12   cholecalciferol (VITAMIN D3) 25 MCG (1000 UNIT) tablet, Take 5,000 Units by mouth daily., Disp: , Rfl:    Eszopiclone  3 MG TABS, Take 1 tablet (3 mg total) by mouth at bedtime. Take immediately before bedtime, Disp: 90 tablet, Rfl: 0   fluconazole  (DIFLUCAN ) 150 MG tablet, Take 1 tablet (150 mg total) by mouth once for 1 dose., Disp: 2 tablet, Rfl: 2   FLUoxetine (PROZAC) 20 MG capsule, Take 3 capsules by mouth daily., Disp: , Rfl:    fluticasone  (FLONASE  SENSIMIST) 27.5 MCG/SPRAY nasal spray, Place 2 sprays into the nose in the morning and at bedtime., Disp: 10 g, Rfl: 12   HYDROcodone  bit-homatropine (HYCODAN) 5-1.5 MG/5ML syrup, Take 5 mLs by mouth every 8 (eight) hours as needed for cough., Disp: 120 mL, Rfl: 0   lidocaine  (XYLOCAINE ) 2 % solution, Use as directed 15 mLs in the mouth or throat as needed for mouth pain., Disp: 100 mL, Rfl: 0   Multiple Vitamin (MULTI-VITAMIN) tablet, Take 1 tablet by mouth daily., Disp: , Rfl:    norethindrone  (ORTHO MICRONOR ) 0.35 MG tablet, Take 1 tablet (0.35 mg total) by mouth daily., Disp: 84 tablet, Rfl: 3   ofloxacin  (FLOXIN ) 0.3 % OTIC solution, Place 4 drops into the left ear 2 (two) times daily for 7 days., Disp: 10 mL, Rfl: 1   predniSONE  (DELTASONE ) 20 MG tablet, Take 2 tablets (40mg ) for 10 days, then take 1 tab (20mg ) for 10 days, then take 1/2 tab (10mg ) for 10 days, Disp: 35 tablet, Rfl: 0   valACYclovir (VALTREX) 500 MG  tablet, Take 1 tablet by mouth daily., Disp: , Rfl:    azithromycin  (ZITHROMAX ) 250 MG tablet, Take first 2 tablets together, then 1 every day until finished. (Patient not taking: Reported on 07/08/2023), Disp: 6 tablet, Rfl: 0   erythromycin  ophthalmic ointment, Place a 1/2 inch ribbon of ointment into the lower eyelid of both eyes nightly for 5 days. (Patient not taking: Reported on 07/08/2023), Disp: 3.5 g, Rfl: 0   triamterene -hydrochlorothiazide (  DYAZIDE) 37.5-25 MG capsule, Take 1 each (1 capsule total) by mouth daily. (Patient not taking: Reported on 07/08/2023), Disp: 90 capsule, Rfl: 0   Physical Exam:   BP 131/84 (BP Location: Right Arm, Patient Position: Sitting, Cuff Size: Normal)   Pulse 78   Ht 5' 8 (1.727 m)   Wt 174 lb (78.9 kg)   SpO2 100%   BMI 26.46 kg/m   Salient findings:  CN II-XII intact Given history and complaints, ear microscopy was indicated and performed for evaluation with findings as below in physical exam section and in procedures; Bilateral EAC clear and TM intact AD; AS: 25% anterior perforation with posterior myringosclerosis, non-marginal, clean; appears chronic; no drainage in ear; no postauricular incision Weber 512: left Rinne 512: AC > BC b/l  Anterior rhinoscopy: Septum intact; bilateral inferior turbinates without significant hypertorphy No lesions of oral cavity/oropharynx No obviously palpable neck masses/lymphadenopathy/thyromegaly No respiratory distress or stridor  Seprately Identifiable Procedures:  Prior to initiating any procedures, risks/benefits/alternatives were explained to the patient and verbal consent obtained. Procedure: Bilateral ear microscopy using microscope (CPT [id number]) Pre-procedure diagnosis: left tympanic membrane perforation Post-procedure diagnosis: same Indication: see above; given patient's otologic complaints and history, for improved and comprehensive examination of external ear and tympanic membrane, bilateral otologic  examination using microscope was performed. Prior to proceeding, verbal consent was obtained after discussion of R/B/A  Procedure: Patient was placed semi-recumbent. Both ear canals were examined using the microscope with findings above. Patient tolerated the procedure well.   Impression & Plans:  Sabrina Owen is a 48 y.o. female with:  1. Tympanic membrane perforation, left   2. Acute otitis media, left   3. Dysfunction of both eustachian tubes   4. Subjective hearing loss   5. 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 We discussed options and do think medical management most appropriate given continued improvement but slow: - Ofloxacin  BID x7d left ear - Left ear keep dry - Continue flonase ; start astelin  BID - Daily sinus rinse - f/u in 4-6 weeks with audio  See below regarding exact medications prescribed this encounter including dosages and route: Meds ordered this encounter  Medications   ofloxacin  (FLOXIN ) 0.3 % OTIC solution    Sig: Place 4 drops into the left ear 2 (two) times daily for 7 days.    Dispense:  10 mL    Refill:  1   fluticasone  (FLONASE  SENSIMIST) 27.5 MCG/SPRAY nasal spray    Sig: Place 2 sprays into the nose in the morning and at bedtime.    Dispense:  10 g    Refill:  12   azelastine  (ASTELIN ) 0.1 % nasal spray    Sig: Place 2 sprays into both nostrils 2 (two) times daily. Use in each nostril as directed    Dispense:  30 mL    Refill:  12      Thank you for allowing me the opportunity to care for your patient. Please do not hesitate to contact me should you have any other questions.  Sincerely, Eldora Blanch, MD Otolaryngologist (ENT), [facility] Phone: [phone] Fax: [phone]  07/10/2023, 12:35 PM   MDM:  Level 4 - 99204 Complexity/Problems addressed: mod - acute problem, chronic problem Data complexity: high  - independent review of notes, labs; independent imaging interpretation - Morbidity: mod  - Prescription Drug prescribed or managed: y

## 2023-07-12 ENCOUNTER — Encounter: Payer: Self-pay | Admitting: Family Medicine

## 2023-07-12 DIAGNOSIS — M328 Other forms of systemic lupus erythematosus: Secondary | ICD-10-CM | POA: Insufficient documentation

## 2023-07-12 DIAGNOSIS — Z79899 Other long term (current) drug therapy: Secondary | ICD-10-CM | POA: Insufficient documentation

## 2023-07-12 NOTE — Assessment & Plan Note (Signed)
 Hycodan to pharmacy

## 2023-07-12 NOTE — Assessment & Plan Note (Addendum)
 Discussed eustachian tube massage Discussed the use of nasal sprays Follow-up with ENT as scheduled

## 2023-07-12 NOTE — Assessment & Plan Note (Signed)
 Controlled, continue current medication regimen.

## 2023-07-12 NOTE — Assessment & Plan Note (Signed)
 Likely contributing to extended course of illness

## 2023-07-12 NOTE — Assessment & Plan Note (Addendum)
 Will send in prednisone , will likely extend course given immunocompromise due to MS and lupus treatment Hycodan to pharmacy Fluconazole  to pharmacy, increased risk of yeast given recent antibiotic use and extended steroid use, immunocompromise

## 2023-07-12 NOTE — Patient Instructions (Signed)
 I have sent in hydrocodone  cough syrup for you to take 5 mL once daily in the evening as needed for cough.  This medication may make you sleepy.  Do not drive or operate heavy machinery while taking this medication.  Follow-up with ENT as scheduled.  I have sent in prednisone  for you to take 40 mg for 10 days, 20 mg for 10 days, then 10 mg for 10 days.  Continue other current medications.  Follow-up with specialist as scheduled.  Follow-up with me for new or worsening symptoms.

## 2023-07-16 ENCOUNTER — Other Ambulatory Visit (HOSPITAL_COMMUNITY)
Admission: RE | Admit: 2023-07-16 | Discharge: 2023-07-16 | Disposition: A | Discharge status: Home / Self Care | Payer: Self-pay | Source: Ambulatory Visit | Attending: Oncology | Admitting: Oncology

## 2023-07-16 ENCOUNTER — Other Ambulatory Visit: Payer: Self-pay | Admitting: Medical Genetics

## 2023-07-16 MED ORDER — HYDROCODONE BIT-HOMATROP MBR 5-1.5 MG/5ML PO SOLN: 5.0000 mL | Freq: Three times a day (TID) | ORAL | 0 refills | Status: DC | PRN

## 2023-07-16 MED ORDER — ALBUTEROL SULFATE HFA 108 (90 BASE) MCG/ACT IN AERS: 2.0000 | INHALATION_SPRAY | Freq: Four times a day (QID) | RESPIRATORY_TRACT | 0 refills | Status: AC | PRN

## 2023-07-20 ENCOUNTER — Other Ambulatory Visit: Payer: Self-pay | Admitting: Internal Medicine

## 2023-07-20 ENCOUNTER — Encounter: Payer: Self-pay | Admitting: Internal Medicine

## 2023-07-20 DIAGNOSIS — I10 Essential (primary) hypertension: Secondary | ICD-10-CM

## 2023-07-23 ENCOUNTER — Ambulatory Visit

## 2023-07-23 ENCOUNTER — Telehealth: Payer: Self-pay

## 2023-07-23 ENCOUNTER — Ambulatory Visit: Admitting: Internal Medicine

## 2023-07-23 ENCOUNTER — Encounter: Payer: Self-pay | Admitting: Internal Medicine

## 2023-07-23 ENCOUNTER — Other Ambulatory Visit: Payer: Self-pay | Admitting: Internal Medicine

## 2023-07-23 VITALS — BP 168/96 | HR 71 | Temp 98.4°F | Resp 16 | Ht 68.0 in | Wt 175.6 lb

## 2023-07-23 DIAGNOSIS — J4541 Moderate persistent asthma with (acute) exacerbation: Secondary | ICD-10-CM | POA: Insufficient documentation

## 2023-07-23 DIAGNOSIS — R052 Subacute cough: Secondary | ICD-10-CM | POA: Diagnosis not present

## 2023-07-23 DIAGNOSIS — K2100 Gastro-esophageal reflux disease with esophagitis, without bleeding: Secondary | ICD-10-CM | POA: Diagnosis not present

## 2023-07-23 DIAGNOSIS — Z1211 Encounter for screening for malignant neoplasm of colon: Secondary | ICD-10-CM

## 2023-07-23 DIAGNOSIS — I10 Essential (primary) hypertension: Secondary | ICD-10-CM | POA: Diagnosis not present

## 2023-07-23 DIAGNOSIS — F5104 Psychophysiologic insomnia: Secondary | ICD-10-CM

## 2023-07-23 MED ORDER — TRIAMTERENE-HCTZ 37.5-25 MG PO CAPS: 1.0000 | ORAL_CAPSULE | Freq: Every day | ORAL | 0 refills | Status: DC

## 2023-07-23 MED ORDER — AMLODIPINE BESYLATE 5 MG PO TABS: 5.0000 mg | ORAL_TABLET | Freq: Every day | ORAL | 0 refills | Status: DC

## 2023-07-23 MED ORDER — ESZOPICLONE 3 MG PO TABS: 3.0000 mg | ORAL_TABLET | Freq: Every day | ORAL | 0 refills | Status: AC

## 2023-07-23 MED ORDER — BUDESONIDE 0.5 MG/2ML IN SUSP: 0.5000 mg | Freq: Two times a day (BID) | RESPIRATORY_TRACT | 5 refills | Status: DC

## 2023-07-23 MED ORDER — VOQUEZNA 10 MG PO TABS: 1.0000 | ORAL_TABLET | Freq: Every day | ORAL | 1 refills | Status: AC

## 2023-07-23 MED ORDER — SPIRONOLACTONE 25 MG PO TABS: 25.0000 mg | ORAL_TABLET | Freq: Every day | ORAL | 0 refills | Status: DC

## 2023-07-23 MED ORDER — HYDROCODONE BIT-HOMATROP MBR 5-1.5 MG/5ML PO SOLN
5.0000 mL | Freq: Three times a day (TID) | ORAL | 0 refills | Status: DC | PRN
Start: 2023-07-23 — End: 2023-08-18

## 2023-07-23 NOTE — Telephone Encounter (Signed)
 Copied from CRM [id number]. Topic: Clinical - Medication Refill >> Jul 23, 2023  4:30 PM Martinique E wrote: Medication: Eszopiclone  3 MG TABS  Has the patient contacted their pharmacy? No (Agent: If no, request that the patient contact the pharmacy for the refill. If patient does not wish to contact the pharmacy document the reason why and proceed with request.) (Agent: If yes, when and what did the pharmacy advise?)  This is the patient's preferred pharmacy:  [id number] [address], [place] - [address] [place] [address] Phone: [phone] Fax: [phone]    Is this the correct pharmacy for this prescription? Yes If no, delete pharmacy and type the correct one.   Has the prescription been filled recently? No  Is the patient out of the medication? No, couple days left.  Has the patient been seen for an appointment in the last year OR does the patient have an upcoming appointment? Yes  Can we respond through MyChart? Yes  Agent: Please be advised that Rx refills may take up to 3 business days. We ask that you follow-up with your pharmacy.

## 2023-07-23 NOTE — Patient Instructions (Signed)
 Hypertension, Adult High blood pressure (hypertension) is when the force of blood pumping through the arteries is too strong. The arteries are the blood vessels that carry blood from the heart throughout the body. Hypertension forces the heart to work harder to pump blood and may cause arteries to become narrow or stiff. Untreated or uncontrolled hypertension can lead to a heart attack, heart failure, a stroke, kidney disease, and other problems. A blood pressure reading consists of a higher number over a lower number. Ideally, your blood pressure should be below 120/80. The first ("top") number is called the systolic pressure. It is a measure of the pressure in your arteries as your heart beats. The second ("bottom") number is called the diastolic pressure. It is a measure of the pressure in your arteries as the heart relaxes. What are the causes? The exact cause of this condition is not known. There are some conditions that result in high blood pressure. What increases the risk? Certain factors may make you more likely to develop high blood pressure. Some of these risk factors are under your control, including: Smoking. Not getting enough exercise or physical activity. Being overweight. Having too much fat, sugar, calories, or salt (sodium) in your diet. Drinking too much alcohol. Other risk factors include: Having a personal history of heart disease, diabetes, high cholesterol, or kidney disease. Stress. Having a family history of high blood pressure and high cholesterol. Having obstructive sleep apnea. Age. The risk increases with age. What are the signs or symptoms? High blood pressure may not cause symptoms. Very high blood pressure (hypertensive crisis) may cause: Headache. Fast or irregular heartbeats (palpitations). Shortness of breath. Nosebleed. Nausea and vomiting. Vision changes. Severe chest pain, dizziness, and seizures. How is this diagnosed? This condition is diagnosed by  measuring your blood pressure while you are seated, with your arm resting on a flat surface, your legs uncrossed, and your feet flat on the floor. The cuff of the blood pressure monitor will be placed directly against the skin of your upper arm at the level of your heart. Blood pressure should be measured at least twice using the same arm. Certain conditions can cause a difference in blood pressure between your right and left arms. If you have a high blood pressure reading during one visit or you have normal blood pressure with other risk factors, you may be asked to: Return on a different day to have your blood pressure checked again. Monitor your blood pressure at home for 1 week or longer. If you are diagnosed with hypertension, you may have other blood or imaging tests to help your health care provider understand your overall risk for other conditions. How is this treated? This condition is treated by making healthy lifestyle changes, such as eating healthy foods, exercising more, and reducing your alcohol intake. You may be referred for counseling on a healthy diet and physical activity. Your health care provider may prescribe medicine if lifestyle changes are not enough to get your blood pressure under control and if: Your systolic blood pressure is above 130. Your diastolic blood pressure is above 80. Your personal target blood pressure may vary depending on your medical conditions, your age, and other factors. Follow these instructions at home: Eating and drinking  Eat a diet that is high in fiber and potassium, and low in sodium, added sugar, and fat. An example of this eating plan is called the DASH diet. DASH stands for Dietary Approaches to Stop Hypertension. To eat this way: Eat  plenty of fresh fruits and vegetables. Try to fill one half of your plate at each meal with fruits and vegetables. Eat whole grains, such as whole-wheat pasta, brown rice, or whole-grain bread. Fill about one  fourth of your plate with whole grains. Eat or drink low-fat dairy products, such as skim milk or low-fat yogurt. Avoid fatty cuts of meat, processed or cured meats, and poultry with skin. Fill about one fourth of your plate with lean proteins, such as fish, chicken without skin, beans, eggs, or tofu. Avoid pre-made and processed foods. These tend to be higher in sodium, added sugar, and fat. Reduce your daily sodium intake. Many people with hypertension should eat less than 1,500 mg of sodium a day. Do not drink alcohol if: Your health care provider tells you not to drink. You are pregnant, may be pregnant, or are planning to become pregnant. If you drink alcohol: Limit how much you have to: 0-1 drink a day for women. 0-2 drinks a day for men. Know how much alcohol is in your drink. In the [place] one drink equals one 12 oz bottle of beer (355 mL), one 5 oz glass of wine (148 mL), or one 1 oz glass of hard liquor (44 mL). Lifestyle  Work with your health care provider to maintain a healthy body weight or to lose weight. Ask what an ideal weight is for you. Get at least 30 minutes of exercise that causes your heart to beat faster (aerobic exercise) most days of the week. Activities may include walking, swimming, or biking. Include exercise to strengthen your muscles (resistance exercise), such as Pilates or lifting weights, as part of your weekly exercise routine. Try to do these types of exercises for 30 minutes at least 3 days a week. Do not use any products that contain nicotine or tobacco. These products include cigarettes, chewing tobacco, and vaping devices, such as e-cigarettes. If you need help quitting, ask your health care provider. Monitor your blood pressure at home as told by your health care provider. Keep all follow-up visits. This is important. Medicines Take over-the-counter and prescription medicines only as told by your health care provider. Follow directions carefully. Blood  pressure medicines must be taken as prescribed. Do not skip doses of blood pressure medicine. Doing this puts you at risk for problems and can make the medicine less effective. Ask your health care provider about side effects or reactions to medicines that you should watch for. Contact a health care provider if you: Think you are having a reaction to a medicine you are taking. Have headaches that keep coming back (recurring). Feel dizzy. Have swelling in your ankles. Have trouble with your vision. Get help right away if you: Develop a severe headache or confusion. Have unusual weakness or numbness. Feel faint. Have severe pain in your chest or abdomen. Vomit repeatedly. Have trouble breathing. These symptoms may be an emergency. Get help right away. Call 911. Do not wait to see if the symptoms will go away. Do not drive yourself to the hospital. Summary Hypertension is when the force of blood pumping through your arteries is too strong. If this condition is not controlled, it may put you at risk for serious complications. Your personal target blood pressure may vary depending on your medical conditions, your age, and other factors. For most people, a normal blood pressure is less than 120/80. Hypertension is treated with lifestyle changes, medicines, or a combination of both. Lifestyle changes include losing weight, eating a healthy,  low-sodium diet, exercising more, and limiting alcohol. This information is not intended to replace advice given to you by your health care provider. Make sure you discuss any questions you have with your health care provider. Document Revised: 10/30/2020 Document Reviewed: 10/30/2020 Elsevier Patient Education  2024 [employer].

## 2023-07-23 NOTE — Telephone Encounter (Signed)
 Waiting for MD to complete note, and then I will attach and submit.

## 2023-07-23 NOTE — Telephone Encounter (Signed)
 Pharmacy Patient Advocate Encounter   Received notification from CoverMyMeds that prior authorization for Voquezna  10MG  tablets is required/requested.   Insurance verification completed.   The patient is insured through [employer] .   Per test claim: PA required; PA submitted to above mentioned insurance via CoverMyMeds Key/confirmation #/EOC [id number] Status is pending

## 2023-07-23 NOTE — Progress Notes (Signed)
 Subjective:  Patient ID: Sabrina Owen, female    DOB: 14-Nov-1975  Age: 48 y.o. MRN: [id number]  CC: Hypertension, Cough, and Gastroesophageal Reflux   HPI [name] presents for f/up --  Discussed the use of AI scribe software for clinical note transcription with the patient, who gave verbal consent to proceed.  History of Present Illness   Sabrina Owen is a 48 year old female who presents with a persistent cough and postnasal drip.  She has experienced a persistent cough for the past month, describing it as constant and severe enough to require sleeping with a cough drop in her mouth and her bed elevated. She takes hydrocodone  before bed to manage the cough, and quercetin has helped her speak in sentences during the day. Severe coughing episodes begin at 5 AM and continue until bedtime.  She reports postnasal drip that does not resolve with nasal sprays, specifically Astelin  and another spray she cannot recall. The postnasal drip leads to coughing up thick yellow sputum in the morning.  She has been treated with Zithromax , Augmentin , and prednisone  for a perforated eardrum and double conjunctivitis, which resolved the infections but not the cough. She is currently on a tapering dose of prednisone , 20 mg, due to recurring throat pain.  No chest pain or blurred vision, but she experiences occasional headaches. She is not currently taking her blood pressure medication, Dyazide, due to episodes of low blood pressure.  She uses albuterol , which provides temporary relief, and takes omeprazole for heartburn following bariatric surgery. She wants to start using a nebulizer.       Outpatient Medications Prior to Visit  Medication Sig Dispense Refill   albuterol  (VENTOLIN  HFA) 108 (90 Base) MCG/ACT inhaler Inhale 2 puffs into the lungs every 6 (six) hours as needed for wheezing or shortness of breath. 8 g 0   amphetamine-dextroamphetamine (ADDERALL XR) 20 MG 24 hr capsule Take 20 mg by mouth  as needed.     azelastine  (ASTELIN ) 0.1 % nasal spray Place 2 sprays into both nostrils 2 (two) times daily. Use in each nostril as directed 30 mL 12   cholecalciferol (VITAMIN D3) 25 MCG (1000 UNIT) tablet Take 5,000 Units by mouth daily.     Eszopiclone  3 MG TABS Take 1 tablet (3 mg total) by mouth at bedtime. Take immediately before bedtime 90 tablet 0   fluconazole  (DIFLUCAN ) 150 MG tablet Take 1 tablet (150 mg total) by mouth once for 1 dose. 2 tablet 2   FLUoxetine (PROZAC) 20 MG capsule Take 3 capsules by mouth daily.     fluticasone  (FLONASE  SENSIMIST) 27.5 MCG/SPRAY nasal spray Place 2 sprays into the nose in the morning and at bedtime. 10 g 12   lidocaine  (XYLOCAINE ) 2 % solution Use as directed 15 mLs in the mouth or throat as needed for mouth pain. 100 mL 0   Multiple Vitamin (MULTI-VITAMIN) tablet Take 1 tablet by mouth daily.     Multiple Vitamins-Minerals (ZINC  PO) Take by mouth.     norethindrone  (ORTHO MICRONOR ) 0.35 MG tablet Take 1 tablet (0.35 mg total) by mouth daily. 84 tablet 3   Omega-3 Fatty Acids (FISH OIL PO) Take by mouth.     predniSONE  (DELTASONE ) 20 MG tablet Take 2 tablets (40mg ) for 10 days, then take 1 tab (20mg ) for 10 days, then take 1/2 tab (10mg ) for 10 days 35 tablet 0   Thiamine HCl (VITAMIN B-1 PO) Take by mouth.     valACYclovir (VALTREX) 500 MG tablet  Take 1 tablet by mouth daily.     HYDROcodone  bit-homatropine (HYCODAN) 5-1.5 MG/5ML syrup Take 5 mLs by mouth every 8 (eight) hours as needed for cough. 120 mL 0   Probiotic Product (PRO-BIOTIC BLEND PO) Take by mouth.     azithromycin  (ZITHROMAX ) 250 MG tablet Take first 2 tablets together, then 1 every day until finished. (Patient not taking: Reported on 07/08/2023) 6 tablet 0   erythromycin  ophthalmic ointment Place a 1/2 inch ribbon of ointment into the lower eyelid of both eyes nightly for 5 days. (Patient not taking: Reported on 07/08/2023) 3.5 g 0   triamterene -hydrochlorothiazide (DYAZIDE) 37.5-25 MG  capsule Take 1 each (1 capsule total) by mouth daily. (Patient not taking: Reported on 07/08/2023) 90 capsule 0   No facility-administered medications prior to visit.    ROS Review of Systems  Constitutional:  Negative for appetite change, chills, diaphoresis, fatigue and fever.  HENT:  Positive for postnasal drip, rhinorrhea and sore throat. Negative for facial swelling, sinus pressure and trouble swallowing.   Eyes: Negative.   Respiratory:  Positive for cough, shortness of breath and wheezing. Negative for stridor.   Cardiovascular:  Negative for chest pain, palpitations and leg swelling.  Gastrointestinal: Negative.  Negative for abdominal pain, constipation, diarrhea, nausea and vomiting.  Endocrine: Negative.   Genitourinary:  Negative for difficulty urinating.  Musculoskeletal:  Positive for back pain. Negative for myalgias.  Skin: Negative.  Negative for color change.  Neurological: Negative.  Negative for dizziness, weakness, light-headedness and headaches.  Hematological:  Negative for adenopathy. Does not bruise/bleed easily.  Psychiatric/Behavioral: Negative.      Objective:  BP (!) 168/96 (BP Location: Left Arm, Patient Position: Sitting, Cuff Size: Normal)   Pulse 71   Temp 98.4 F (36.9 C) (Oral)   Resp 16   Ht 5' 8 (1.727 m)   Wt 175 lb 9.6 oz (79.7 kg)   SpO2 99%   BMI 26.70 kg/m   BP Readings from Last 3 Encounters:  07/23/23 (!) 168/96  07/08/23 131/84  07/06/23 130/82    Wt Readings from Last 3 Encounters:  07/23/23 175 lb 9.6 oz (79.7 kg)  07/08/23 174 lb (78.9 kg)  07/06/23 174 lb (78.9 kg)    Physical Exam Vitals reviewed.  Constitutional:      General: She is not in acute distress.    Appearance: She is ill-appearing. She is not toxic-appearing or diaphoretic.  HENT:     Mouth/Throat:     Mouth: Mucous membranes are moist.  Eyes:     General: No scleral icterus.    Conjunctiva/sclera: Conjunctivae normal.  Cardiovascular:     Rate and  Rhythm: Normal rate and regular rhythm.     Heart sounds: No murmur heard.    No friction rub. No gallop.  Pulmonary:     Breath sounds: No stridor. No wheezing, rhonchi or rales.  Abdominal:     General: Abdomen is flat.     Palpations: There is no mass.     Tenderness: There is no abdominal tenderness. There is no guarding.     Hernia: No hernia is present.  Musculoskeletal:        General: Normal range of motion.     Cervical back: Neck supple.     Right lower leg: No edema.     Left lower leg: No edema.  Lymphadenopathy:     Cervical: No cervical adenopathy.  Skin:    General: Skin is warm and dry.  Findings: No rash.  Neurological:     General: No focal deficit present.     Mental Status: She is alert. Mental status is at baseline.  Psychiatric:        Mood and Affect: Mood normal.        Behavior: Behavior normal.     Lab Results  Component Value Date   WBC 5.4 05/29/2023   HGB 12.3 05/29/2023   HCT 37.8 05/29/2023   PLT 335 05/29/2023   GLUCOSE 83 10/04/2021   CHOL 131 07/11/2020   TRIG 56 07/11/2020   HDL 58 07/11/2020   LDLCALC 64 07/11/2020   ALT 25 04/22/2023   AST 32 04/22/2023   NA 139 04/22/2023   K 3.6 04/22/2023   CL 105 04/22/2023   CREATININE 0.7 04/22/2023   BUN 9 04/22/2023   CO2 24 (A) 04/22/2023   TSH 2.20 04/23/2023   DG Chest 2 View Result Date: 07/23/2023 CLINICAL DATA:  Cough.  Shortness of breath for 1 month. EXAM: CHEST - 2 VIEW COMPARISON:  06/27/2023. FINDINGS: Bilateral lung fields are clear. Bilateral costophrenic angles are clear. Normal cardio-mediastinal silhouette. No acute osseous abnormalities. The soft tissues are within normal limits. IMPRESSION: No active cardiopulmonary disease. Electronically Signed   By: Ree Molt M.D.   On: 07/23/2023 14:51   DG Chest 2 View Result Date: 06/29/2023 CLINICAL DATA:  Cough.  Decreased breath sounds in the upper right. EXAM: CHEST - 2 VIEW COMPARISON:  None Available. FINDINGS:  Normal heart, mediastinum and hila. Clear lungs.  No pleural effusion or pneumothorax. Skeletal structures are unremarkable. IMPRESSION: No active cardiopulmonary disease. Electronically Signed   By: Alm Parkins M.D.   On: 06/29/2023 12:07     Assessment & Plan:  Subacute cough- Will treat the GERD and asthma. -     DG Chest 2 View; Future -     HYDROcodone  Bit-Homatrop MBr; Take 5 mLs by mouth every 8 (eight) hours as needed for cough.  Dispense: 120 mL; Refill: 0  Screen for colon cancer -     Ambulatory referral to Gastroenterology  Moderate persistent asthma with acute exacerbation -     Budesonide ; Take 2 mLs (0.5 mg total) by nebulization 2 (two) times daily.  Dispense: 120 mL; Refill: 5 -     For home use only DME Nebulizer machine -     AMB Referral [facility] Care Management  Gastroesophageal reflux disease with esophagitis without hemorrhage -     Voquezna ; Take 1 tablet by mouth daily.  Dispense: 90 tablet; Refill: 1 -     AMB Referral [facility] Care Management  Hypertension, unspecified type- Will try to achieve better BP control. -     Spironolactone ; Take 1 tablet (25 mg total) by mouth daily.  Dispense: 90 tablet; Refill: 0 -     AMB Referral [facility] Care Management -     amLODIPine  Besylate; Take 1 tablet (5 mg total) by mouth daily.  Dispense: 90 tablet; Refill: 0     Follow-up: Return in about 3 months (around 10/23/2023).  Debby Molt, MD

## 2023-07-24 ENCOUNTER — Telehealth: Payer: Self-pay

## 2023-07-24 NOTE — Telephone Encounter (Signed)
 Copied from CRM [id number]. 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

## 2023-07-27 LAB — GENECONNECT MOLECULAR SCREEN: Genetic Analysis Overall Interpretation: NEGATIVE

## 2023-07-28 ENCOUNTER — Other Ambulatory Visit (HOSPITAL_COMMUNITY): Payer: Self-pay

## 2023-07-30 ENCOUNTER — Other Ambulatory Visit (HOSPITAL_COMMUNITY): Payer: Self-pay

## 2023-07-30 NOTE — Telephone Encounter (Signed)
 Patient has been made aware. She states that she doesn't have $60 a month to pay for medication.

## 2023-07-30 NOTE — Telephone Encounter (Signed)
 Pharmacy Patient Advocate Encounter  Received notification from [employer] that Prior Authorization for Voquezna  10MG  tablets has been APPROVED from 06/30/2023 to 07/29/2024. Ran test claim, Copay is $60. This test claim was processed through [facility]- copay amounts may vary at other pharmacies due to pharmacy/plan contracts, or as the patient moves through the different stages of their insurance plan.   PA #/Case ID/Reference #: [id number]

## 2023-07-31 ENCOUNTER — Other Ambulatory Visit: Payer: Self-pay

## 2023-07-31 DIAGNOSIS — J4541 Moderate persistent asthma with (acute) exacerbation: Secondary | ICD-10-CM

## 2023-07-31 MED ORDER — BUDESONIDE 0.5 MG/2ML IN SUSP: 0.5000 mg | Freq: Two times a day (BID) | RESPIRATORY_TRACT | 5 refills | Status: AC

## 2023-07-31 NOTE — Telephone Encounter (Signed)
Medication has been sent to her local pharmacy.  

## 2023-08-03 ENCOUNTER — Telehealth: Payer: Self-pay | Admitting: Internal Medicine

## 2023-08-03 ENCOUNTER — Encounter: Payer: Self-pay | Admitting: Genetic Counselor

## 2023-08-03 IMAGING — CT CT ABD-PELV W/ CM
2 of 5 series · 16 of 46 positions shown, 18 images · IV contrast (Omnipaque)
Comparison: CT abdomen/pelvis 06/03/2019

CLINICAL DATA: Lower abdominal pain, nausea, increased urination

EXAM:
CT ABDOMEN AND PELVIS WITH CONTRAST
TECHNIQUE: Multidetector CT imaging of the abdomen and pelvis was performed
using the standard protocol following bolus administration of
intravenous contrast.
CONTRAST:  85mL OMNIPAQUE IOHEXOL 350 MG/ML SOLN

[Series 2: axial st · axial · 0.81mm/px · z∈[-666,-266]mm · 13 of 90 slices shown, 15 images]
[im 5/90  soft-tissue]
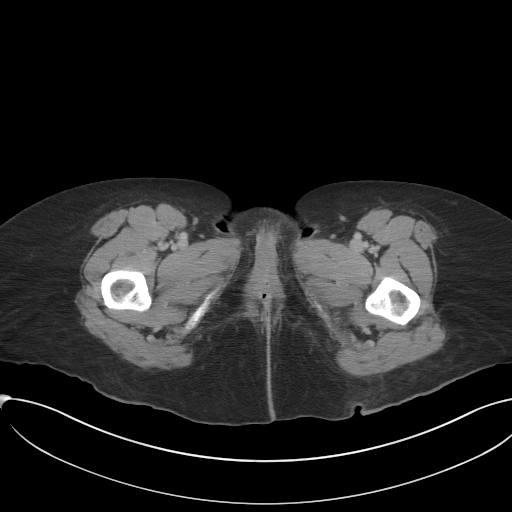
[im 5/90  bone]
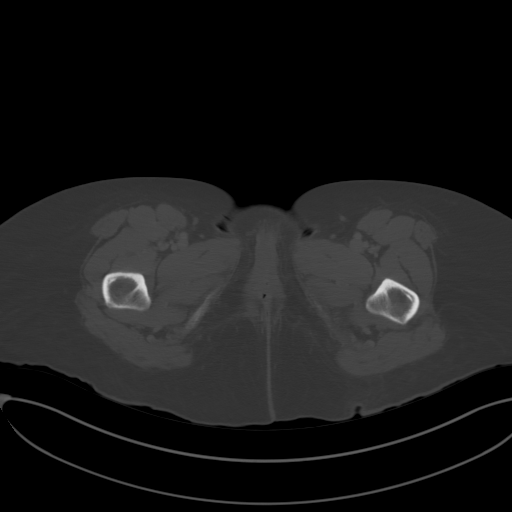
[im 15/90  soft-tissue]
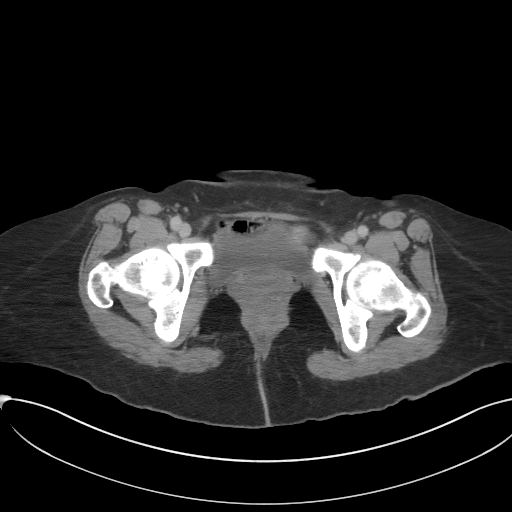
[im 19/90  soft-tissue]
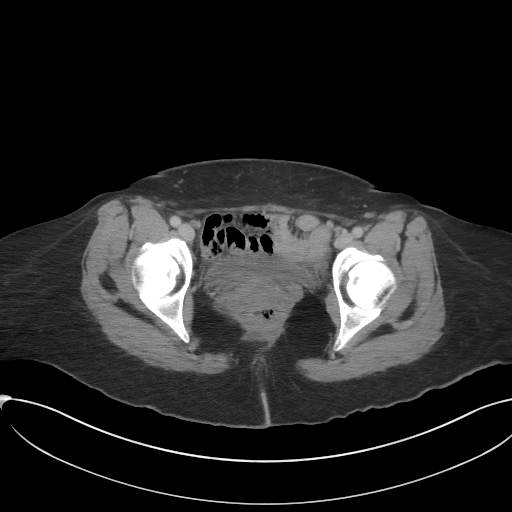
[im 24/90  soft-tissue]
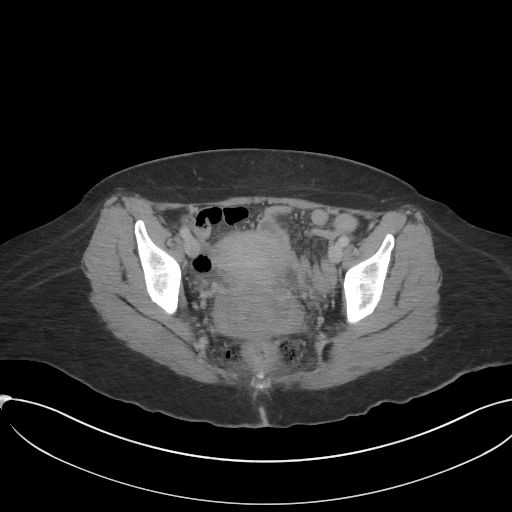
[im 33/90  soft-tissue]
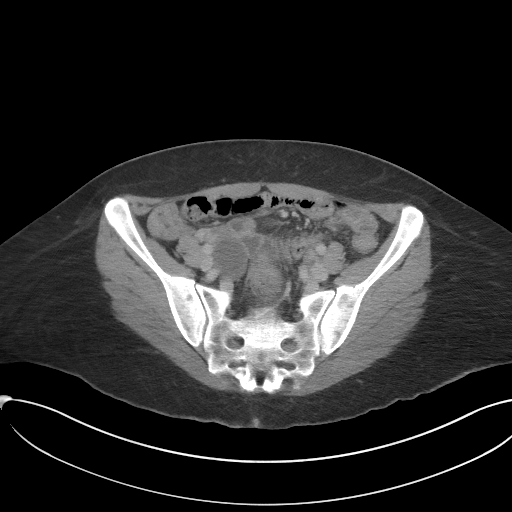
[im 38/90  soft-tissue]
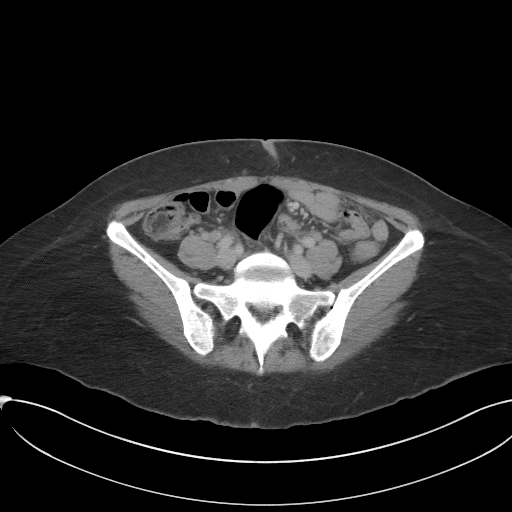
[im 47/90  soft-tissue]
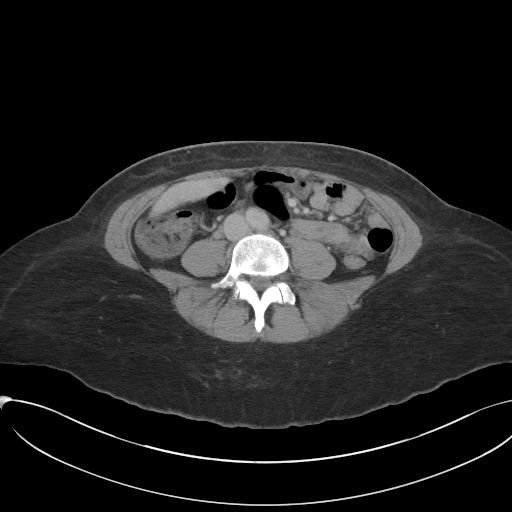
[im 52/90  soft-tissue]
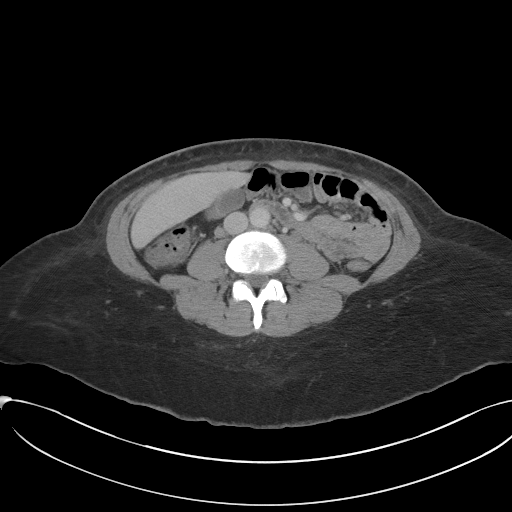
[im 57/90  soft-tissue]
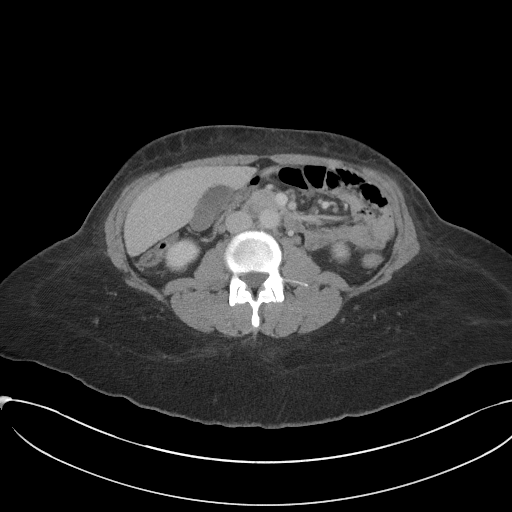
[im 57/90  bone]
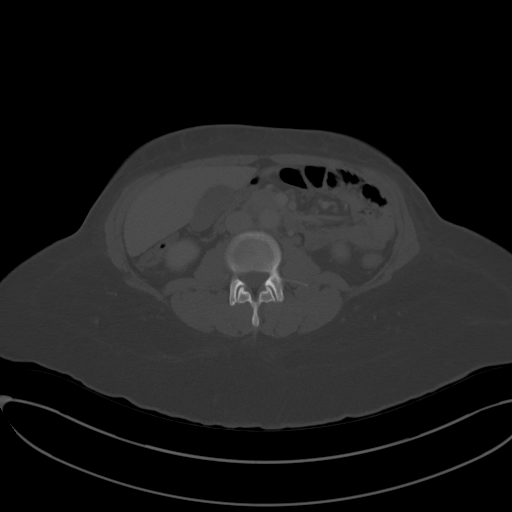
[im 66/90  soft-tissue]
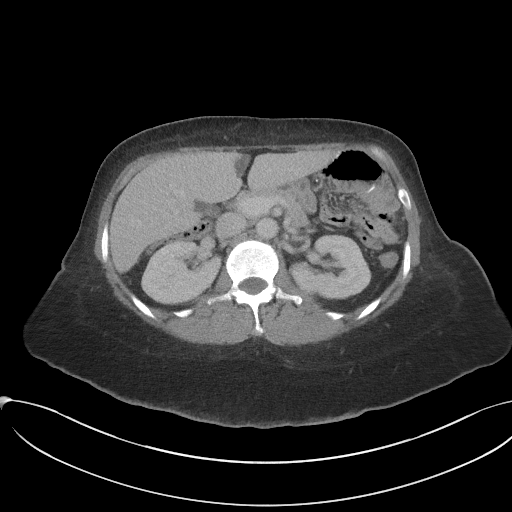
[im 71/90  soft-tissue]
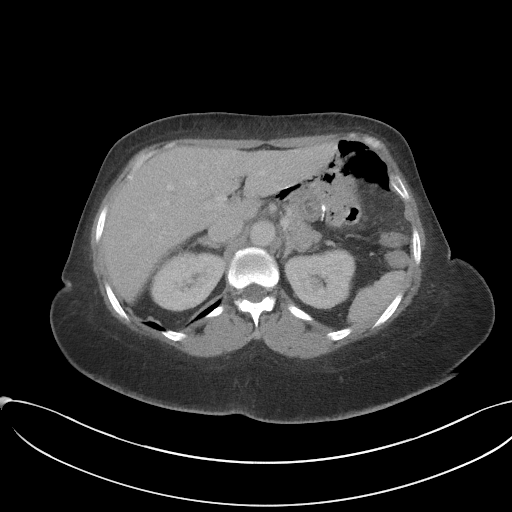
[im 75/90  soft-tissue]
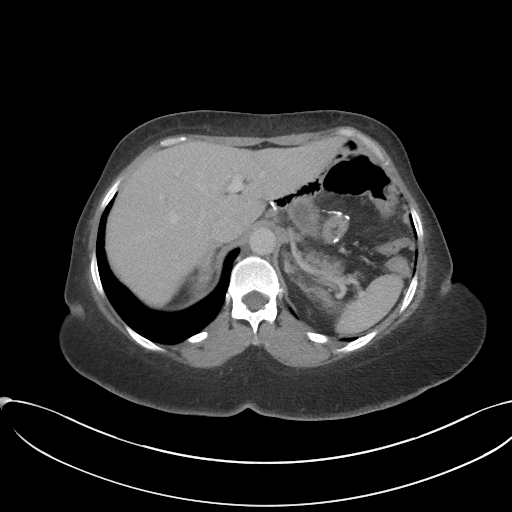
[im 85/90  soft-tissue]
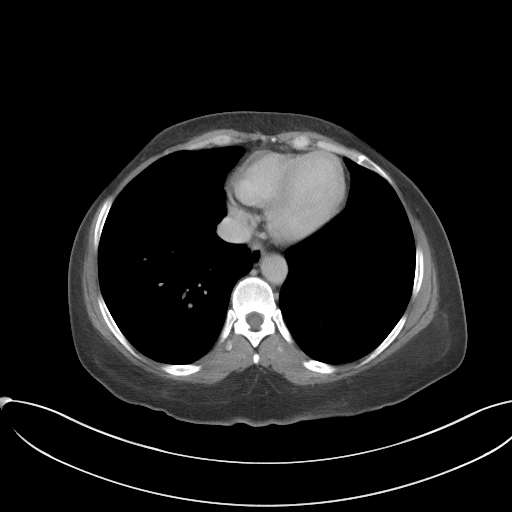

[Series 5: coronal st · coronal · 0.70mm/px · 3 of 83 slices shown]
[im 28/83  soft-tissue]
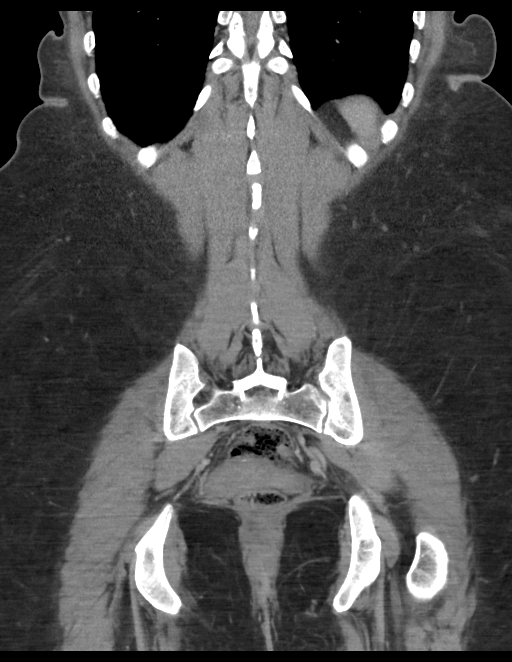
[im 37/83  soft-tissue]
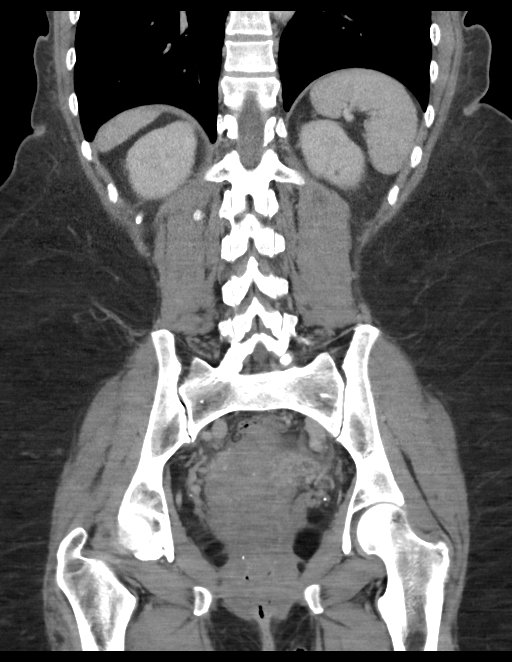
[im 46/83  soft-tissue]
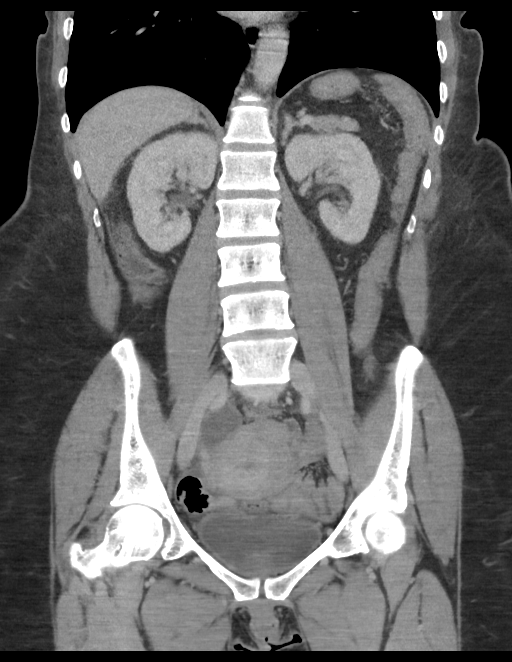

[16 of 46 positions shown; findings below may reference images not displayed]

FINDINGS: Lower chest: The lung bases are clear. The imaged heart is
unremarkable.

Hepatobiliary: Hypodensity along the falciform ligament is
unchanged, likely reflecting fatty infiltration. A subcentimeter
hypodense lesion in the left hepatic lobe is too small to
characterize but likely reflects a cyst. There are no suspicious
lesions. The gallbladder is unremarkable. There is no biliary ductal
dilatation.

Pancreas: Unremarkable.

Spleen: Unremarkable.

Adrenals/Urinary Tract: The adrenals are unremarkable.

There is an 11 mm nonobstructing left lower pole renal stone,
increased in size since the prior study. No other focal lesions are
stones are identified. There is no hydronephrosis or hydroureter.
The bladder is decompressed but grossly unremarkable.

Stomach/Bowel: The patient is status post Roux-en-Y gastric bypass.
There is no evidence of complication at the anastomotic sites. There
is no evidence of bowel obstruction. There is no abnormal bowel wall
thickening or inflammatory change.

Vascular/Lymphatic: The abdominal aorta is nonaneurysmal. The major
branch vessels are patent. The main portal and splenic veins are
patent. Incidental note is made of a retroaortic left renal vein.

Reproductive: There is a 2.7 cm intramural fibroid in the posterior
aspect of the uterus, unchanged. There is a 3.0 cm right adnexal
cyst. There is no left adnexal mass.

Other: There is no ascites or free air.

Musculoskeletal: There is no acute osseous abnormality or aggressive
osseous lesion.
IMPRESSION: 1. No acute findings in the abdomen or pelvis.
[DATE] cm nonobstructing left lower pole renal stone has increased
in size since the prior study from 3233.
3. Fibroid uterus.

## 2023-08-03 NOTE — Telephone Encounter (Signed)
 Copied from CRM [id number]. Topic: General - Other >> Aug 03, 2023  8:40 AM Henretta I wrote: Reason for CRM: Camelia called because they faxed over paperwork on Friday for updated orders on patients Nebulizer and just wanted to make sure it was received

## 2023-08-04 ENCOUNTER — Inpatient Hospital Stay: Attending: Hematology and Oncology | Admitting: Genetic Counselor

## 2023-08-04 ENCOUNTER — Encounter: Payer: Self-pay | Admitting: Internal Medicine

## 2023-08-04 ENCOUNTER — Other Ambulatory Visit

## 2023-08-04 NOTE — Telephone Encounter (Signed)
 Copied from CRM [id number]. Topic: Clinical - Medication Question >> Aug 04, 2023  3:11 PM Pinkey ORN wrote: Reason for CRM: Request >> Aug 04, 2023  3:13 PM Pinkey ORN wrote: Patient states she sent a message via mychart that she has pink eye, patient is desperately requesting that some eye drops be sent over to the pharmacy before end of business day today. Patient states she can be communicated with via mychart.

## 2023-08-05 ENCOUNTER — Other Ambulatory Visit: Payer: Self-pay | Admitting: Internal Medicine

## 2023-08-05 MED ORDER — NEOMYCIN-POLYMYXIN-DEXAMETH 3.5-10000-0.1 OP SUSP: 2.0000 [drp] | Freq: Four times a day (QID) | OPHTHALMIC | 0 refills | Status: AC

## 2023-08-05 NOTE — Telephone Encounter (Signed)
 Patient has been made aware that Dr. Joshua did sign the form and we have faxed it. She gave a verbal understanding and states that she did receive her medicine.

## 2023-08-05 NOTE — Telephone Encounter (Signed)
 Spoke with the patient about this earlier.

## 2023-08-07 ENCOUNTER — Telehealth: Payer: Self-pay | Admitting: *Deleted

## 2023-08-07 NOTE — Progress Notes (Signed)
 Care Guide Pharmacy Note  08/07/2023 Name: Sabrina Owen MRN: [id number] DOB: [birth year]  Referred By: Joshua Debby CROME, MD Reason for referral: Complex Care Management (Initial outreach to schedule referral with PharmD )   Sabrina Owen is a 48 y.o. year old female who is a primary care patient of Joshua Debby CROME, MD.  Sabrina Owen was referred to the pharmacist for assistance related to: HTN and gastro reflux, asthma   Successful contact was made with the patient to discuss pharmacy services including being ready for the pharmacist to call at least 5 minutes before the scheduled appointment time and to have medication bottles and any blood pressure readings ready for review. The patient agreed to meet with the pharmacist via telephone visit on (date/time).10:00 AM 08/21/23  [name] Guide  Direct Dial: [phone]  Fax [phone]

## 2023-08-11 ENCOUNTER — Encounter: Payer: Self-pay | Admitting: Genetic Counselor

## 2023-08-11 ENCOUNTER — Other Ambulatory Visit: Payer: Self-pay | Admitting: Genetic Counselor

## 2023-08-11 ENCOUNTER — Inpatient Hospital Stay

## 2023-08-11 ENCOUNTER — Inpatient Hospital Stay: Attending: Hematology and Oncology | Admitting: Genetic Counselor

## 2023-08-11 DIAGNOSIS — Z1379 Encounter for other screening for genetic and chromosomal anomalies: Secondary | ICD-10-CM

## 2023-08-11 DIAGNOSIS — Z803 Family history of malignant neoplasm of breast: Secondary | ICD-10-CM | POA: Diagnosis not present

## 2023-08-11 DIAGNOSIS — Z8042 Family history of malignant neoplasm of prostate: Secondary | ICD-10-CM | POA: Diagnosis not present

## 2023-08-11 DIAGNOSIS — E559 Vitamin D deficiency, unspecified: Secondary | ICD-10-CM | POA: Insufficient documentation

## 2023-08-11 DIAGNOSIS — Z800 Family history of malignant neoplasm of digestive organs: Secondary | ICD-10-CM

## 2023-08-11 DIAGNOSIS — E538 Deficiency of other specified B group vitamins: Secondary | ICD-10-CM | POA: Insufficient documentation

## 2023-08-11 DIAGNOSIS — Z9884 Bariatric surgery status: Secondary | ICD-10-CM | POA: Insufficient documentation

## 2023-08-11 DIAGNOSIS — D539 Nutritional anemia, unspecified: Secondary | ICD-10-CM | POA: Insufficient documentation

## 2023-08-11 LAB — GENETIC SCREENING ORDER

## 2023-08-11 NOTE — Progress Notes (Signed)
 REFERRING PROVIDER: Lonn Hicks, MD [address] [place],  [place] [address]  PRIMARY PROVIDER:  Joshua Debby CROME, MD  PRIMARY REASON FOR VISIT:  1. Family history of breast cancer   2. Family history of pancreatic cancer   3. Family history of colon cancer   4. Family history of prostate cancer    HISTORY OF PRESENT ILLNESS:   Sabrina Owen, a 48 y.o. female, was seen for a [place] cancer genetics consultation at the request of Dr. Lonn due to a family history of cancer. Sabrina Owen presents to clinic today to discuss the possibility of a hereditary predisposition to cancer, to discuss genetic testing, and to further clarify her future cancer risks, as well as potential cancer risks for family members.   Sabrina Owen is a 48 y.o. female with no personal history of cancer.    RISK FACTORS:  Menarche was at age 34.  First live birth at age 68.  OCP use for approximately 10 years.  Ovaries intact: yes.  Uterus intact: yes.  Menopausal status: premenopausal.  HRT use: 0 years. Colonoscopy: no Mammogram within the last year: yes. Number of breast biopsies: 0. 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 child    Past Surgical History:  Procedure Laterality Date   CATARACT EXTRACTION     ENDOMETRIAL ABLATION     EYE SURGERY     GASTRIC BYPASS     TUBAL LIGATION      Social History   Socioeconomic History   Marital status: Married    Spouse name: Not on file   Number of children: 2   Years of education: Not on file   Highest education level: Bachelor's degree (e.g., BA, AB, BS)  Occupational History   Not on file  Tobacco Use   Smoking status: Never   Smokeless tobacco: Never  Vaping Use   Vaping  status: Never Used  Substance and Sexual Activity   Alcohol use: Yes    Comment: occ   Drug use: Never   Sexual activity: Yes    Partners: Male    Birth control/protection: Surgical    Comment: ablation, btl  Other Topics Concern   Not on file  Social History Narrative   Not on file   Social Drivers of Health   Financial Resource Strain: Medium Risk (06/10/2022)   Overall Financial Resource Strain (CARDIA)    Difficulty of Paying Living Expenses: Somewhat hard  Food Insecurity: No Food Insecurity (06/10/2022)   Hunger Vital Sign    Worried About Running Out of Food in the Last Year: Never true    Ran Out of Food in the Last Year: Never true  Transportation Needs: No Transportation Needs (06/10/2022)   PRAPARE - Administrator, Civil Service (Medical): No    Lack of Transportation (Non-Medical): No  Physical Activity: Unknown (06/10/2022)   Exercise Vital Sign    Days of Exercise per Week: 0 days    Minutes of Exercise per Session: Not on file  Stress: Stress Concern Present (06/10/2022)   [employer] of Occupational Health - Occupational Stress Questionnaire    Feeling of Stress :  To some extent  Social Connections: Moderately Integrated (06/10/2022)   Social Connection and Isolation Panel    Frequency of Communication with Friends and Family: More than three times a week    Frequency of Social Gatherings with Friends and Family: More than three times a week    Attends Religious Services: 1 to 4 times per year    Active Member of [employer] or Organizations: No    Attends Engineer, structural: Not on file    Marital Status: Married     FAMILY HISTORY:  We obtained a detailed, 4-generation family history.  Significant diagnoses are listed below: Family History  Problem Relation Age of Onset   Lung cancer Maternal Grandmother        smoked   Multiple myeloma Maternal Aunt 60 - 69       multiple myeloma   Breast cancer Maternal Aunt 60 - 69       triple neg    Pancreatic cancer Maternal Aunt 60 - 69   Lung cancer Maternal Uncle 60 - 69       separate primary cancer   Prostate cancer Maternal Uncle 60 - 69   Prostate cancer Maternal Uncle 60 - 69   Multiple myeloma Maternal Cousin 60 - 69   Colon cancer Maternal Cousin 60 - 69   Colon cancer Maternal Cousin 60 - 69   Thyroid  cancer Maternal Cousin        dx. <50     Sabrina Owen is unaware of previous family history of genetic testing for hereditary cancer risks. There is no reported Ashkenazi Jewish ancestry.   GENETIC COUNSELING ASSESSMENT: Sabrina Owen is a 48 y.o. female with a family history of cancer which is somewhat suggestive of a hereditary predisposition to cancer. We, therefore, discussed and recommended the following at today's visit.   DISCUSSION: We discussed that 5 - 10% of cancer is hereditary, with most cases of hereditary breast and pancreatic cancer associated with BRCA1/2.  There are other genes that can be associated with hereditary cancer syndromes.  We discussed that testing is beneficial for several reasons, including knowing about other cancer risks, identifying potential screening and risk-reduction options that may be appropriate, and to understanding if other family members could be at risk for cancer and allowing them to undergo genetic testing.  We reviewed the characteristics, features and inheritance patterns of hereditary cancer syndromes. We also discussed genetic testing, including the appropriate family members to test, the process of testing, insurance coverage and turn-around-time for results. We discussed the implications of a negative, positive, carrier and/or variant of uncertain significant result. We discussed that negative results would be uninformative given that Sabrina Owen does not have a personal history of cancer. We recommended Sabrina Owen pursue genetic testing for a panel that contains genes associated with cancer.  Sabrina Owen was offered a common hereditary  cancer panel (40 genes) and an expanded pan-cancer panel (77 genes). Sabrina Owen was informed of the benefits and limitations of each panel, including that expanded pan-cancer panels contain several genes that do not have clear management guidelines at this point in time.  We also discussed that as the number of genes included on a panel increases, the chances of variants of uncertain significance increases.  After considering the benefits and limitations of each gene panel, Sabrina Owen elected to have [facility] CancerNext-Expanded Panel.   The CancerNext-Expanded gene panel offered by [facility] and includes sequencing, rearrangement, and RNA analysis for the following  77 genes: AIP, ALK, APC, ATM, AXIN2, BAP1, BARD1, BMPR1A, BRCA1, BRCA2, BRIP1, CDC73, CDH1, CDK4, CDKN1B, CDKN2A, CEBPA, CHEK2, CTNNA1, DDX41, DICER1, ETV6, FH, FLCN, GATA2, LZTR1, MAX, MBD4, MEN1, MET, MLH1, MSH2, MSH3, MSH6, MUTYH, NF1, NF2, NTHL1, PALB2, PHOX2B, PMS2, POT1, PRKAR1A, PTCH1, PTEN, RAD51C, RAD51D, RB1, RET, RPS20, RUNX1, SDHA, SDHAF2, SDHB, SDHC, SDHD, SMAD4, SMARCA4, SMARCB1, SMARCE1, STK11, SUFU, TMEM127, TP53, TSC1, TSC2, VHL, and WT1 (sequencing and deletion/duplication); EGFR, HOXB13, KIT, MITF, PDGFRA, POLD1, and POLE (sequencing only); EPCAM and GREM1 (deletion/duplication only).   Based on Sabrina Owen's family history of cancer, she meets medical criteria for genetic testing. Despite that she meets criteria, she may still have an out of pocket cost. We discussed that if her out of pocket cost for testing is over $100, the laboratory should contact them to discuss self-pay prices, patient pay assistance programs, if applicable, and other billing options.  We discussed that some people do not want to undergo genetic testing due to fear of genetic discrimination.  A federal law called the Genetic Information Non-Discrimination Act (GINA) of 2008 helps protect individuals against genetic discrimination based on their genetic test  results.  It impacts both health insurance and employment.  With health insurance, it protects against increased premiums, being kicked off insurance or being forced to take a test in order to be insured.  For employment it protects against hiring, firing and promoting decisions based on genetic test results.  GINA does not apply to those in the [employer], those who work for companies with less than 15 employees, and new life insurance or long-term disability insurance policies.  Health status due to a cancer diagnosis is not protected under GINA.  PLAN: After considering the risks, benefits, and limitations, Sabrina Owen provided informed consent to pursue genetic testing and the blood sample was sent to [facility] for analysis of the CancerNext-Expanded Panel. Results should be available within approximately 2-3 weeks' time, at which point they will be disclosed by telephone to Sabrina Owen, as will any additional recommendations warranted by these results. Sabrina Owen will receive a summary of her genetic counseling visit and a copy of her results once available. This information will also be available in Epic.   Sabrina Owen questions were answered to her satisfaction today. Our contact information was provided should additional questions or concerns arise. Thank you for the referral and allowing us  to share in the care of your patient.   Ladislav Caselli, MS, [facility] Genetic Counselor [email or web address] (P) [phone]  60 minutes were spent on the date of the encounter in service to the patient including preparation, face-to-face consultation, documentation and care coordination. The patient was seen alone.  Drs. Gudena and/or Lanny were available to discuss this case as needed.  _______________________________________________________________________ For Office Staff:  Number of people involved in session: 1 Was an Intern/ student involved with case: no

## 2023-08-18 ENCOUNTER — Ambulatory Visit: Admitting: Internal Medicine

## 2023-08-18 ENCOUNTER — Encounter: Payer: Self-pay | Admitting: Internal Medicine

## 2023-08-18 VITALS — BP 120/72 | HR 61 | Temp 98.3°F | Ht 68.0 in | Wt 175.0 lb

## 2023-08-18 DIAGNOSIS — J329 Chronic sinusitis, unspecified: Secondary | ICD-10-CM | POA: Insufficient documentation

## 2023-08-18 DIAGNOSIS — E559 Vitamin D deficiency, unspecified: Secondary | ICD-10-CM

## 2023-08-18 DIAGNOSIS — G35 Multiple sclerosis: Secondary | ICD-10-CM | POA: Diagnosis not present

## 2023-08-18 MED ORDER — LEVOFLOXACIN 500 MG PO TABS: 500.0000 mg | ORAL_TABLET | Freq: Every day | ORAL | 0 refills | Status: DC

## 2023-08-18 MED ORDER — PREDNISONE 10 MG PO TABS
ORAL_TABLET | ORAL | 0 refills | Status: DC
Start: 2023-08-18 — End: 2023-09-04

## 2023-08-18 MED ORDER — HYDROCODONE BIT-HOMATROP MBR 5-1.5 MG/5ML PO SOLN: 5.0000 mL | Freq: Four times a day (QID) | ORAL | 0 refills | Status: AC | PRN

## 2023-08-18 MED ORDER — METHYLPREDNISOLONE ACETATE 80 MG/ML IJ SUSP
80.0000 mg | Freq: Once | INTRAMUSCULAR | Status: AC
  Administered 2023-08-18 (×2): 80 mg via INTRAMUSCULAR

## 2023-08-18 NOTE — Assessment & Plan Note (Signed)
 Mild to mod, for antibx course levaquin  500 every day, hycodan prn, prednisone  taper, and depomedrol 80 mg IM,   to f/u any worsening symptoms or concerns, also for CT sinus r/o obstruction

## 2023-08-18 NOTE — Assessment & Plan Note (Signed)
 Onset symptoms after start tx per neurology with [facility], may need to reevaluate for further symptoms

## 2023-08-18 NOTE — Progress Notes (Signed)
 Patient ID: Sabrina Owen, female   DOB: Nov 25, 1975, 48 y.o.   MRN: [id number]        Chief Complaint: follow up prod cough, CT sinus       HPI:  Sabrina Owen is a 48 y.o. female here with c/o recurring episodes of similar symptoms multiple times over 2 months, after onset of treatment for MS with [facility].  At one point had double pink eyel, perforated left TM requiring ENT eval.  Now has contd sinus pain pressure, ears aching, HA, feeling poorly, can't sleep at night due to scant prod cough, mild sob  .  Has been treated over this time with zpack, then augmentin , prednisone , nasal sprays, mucinex  and nebulizers.  Pt denies chest pain, wheezing, orthopnea, PND, increased LE swelling, palpitations, dizziness or syncope. No hx of copd or asthma,         Wt Readings from Last 3 Encounters:  08/18/23 175 lb (79.4 kg)  07/23/23 175 lb 9.6 oz (79.7 kg)  07/08/23 174 lb (78.9 kg)   BP Readings from Last 3 Encounters:  08/18/23 120/72  07/23/23 (!) 168/96  07/08/23 131/84         Past Medical History:  Diagnosis Date   Allergy    Topamax   Anemia    Anxiety 1995   Cataract 2009   Depression 1995   GERD (gastroesophageal reflux disease)    Lupus    MS (multiple sclerosis) (HCC)    Stroke (HCC) 2007   After birth of second child   Past Surgical History:  Procedure Laterality Date   CATARACT EXTRACTION     ENDOMETRIAL ABLATION     EYE SURGERY     GASTRIC BYPASS     TUBAL LIGATION      reports that she has never smoked. She has never used smokeless tobacco. She reports current alcohol use. She reports that she does not use drugs. family history includes Breast cancer (age of onset: 53 - 52) in her maternal aunt; Colon cancer (age of onset: 55 - 41) in her maternal cousin and maternal cousin; Depression in her maternal grandmother and mother; Diabetes in her maternal grandfather; Hypertension in her father, maternal aunt, maternal aunt, and mother; Lung cancer in her maternal grandmother;  Lung cancer (age of onset: 3 - 70) in her maternal uncle; Multiple myeloma (age of onset: 73 - 34) in her maternal aunt and maternal cousin; Obesity in her mother; Pancreatic cancer (age of onset: 6 - 58) in her maternal aunt; Prostate cancer (age of onset: 29 - 66) in her maternal uncle and maternal uncle; Thyroid  cancer in her maternal cousin. Allergies  Allergen Reactions   Topamax [Topiramate]    Current Outpatient Medications on File Prior to Visit  Medication Sig Dispense Refill   albuterol  (VENTOLIN  HFA) 108 (90 Base) MCG/ACT inhaler Inhale 2 puffs into the lungs every 6 (six) hours as needed for wheezing or shortness of breath. 8 g 0   amphetamine-dextroamphetamine (ADDERALL XR) 20 MG 24 hr capsule Take 20 mg by mouth as needed.     azelastine  (ASTELIN ) 0.1 % nasal spray Place 2 sprays into both nostrils 2 (two) times daily. Use in each nostril as directed 30 mL 12   budesonide  (PULMICORT ) 0.5 MG/2ML nebulizer solution Take 2 mLs (0.5 mg total) by nebulization 2 (two) times daily. 120 mL 5   cholecalciferol (VITAMIN D3) 25 MCG (1000 UNIT) tablet Take 5,000 Units by mouth daily.     Eszopiclone  3 MG TABS  Take 1 tablet (3 mg total) by mouth at bedtime. Take immediately before bedtime 90 tablet 0   fluconazole  (DIFLUCAN ) 150 MG tablet Take 1 tablet (150 mg total) by mouth once for 1 dose. 2 tablet 2   FLUoxetine (PROZAC) 20 MG capsule Take 3 capsules by mouth daily.     fluticasone  (FLONASE  SENSIMIST) 27.5 MCG/SPRAY nasal spray Place 2 sprays into the nose in the morning and at bedtime. 10 g 12   lidocaine  (XYLOCAINE ) 2 % solution Use as directed 15 mLs in the mouth or throat as needed for mouth pain. 100 mL 0   Multiple Vitamin (MULTI-VITAMIN) tablet Take 1 tablet by mouth daily.     Multiple Vitamins-Minerals (ZINC  PO) Take by mouth.     neomycin -polymyxin b-dexamethasone (MAXITROL) 3.5-10000-0.1 SUSP Place 2 drops into both eyes every 6 (six) hours. 5 mL 0   norethindrone  (ORTHO  MICRONOR ) 0.35 MG tablet Take 1 tablet (0.35 mg total) by mouth daily. 84 tablet 3   Omega-3 Fatty Acids (FISH OIL PO) Take by mouth.     Thiamine HCl (VITAMIN B-1 PO) Take by mouth.     valACYclovir (VALTREX) 500 MG tablet Take 1 tablet by mouth daily.     Vonoprazan Fumarate  (VOQUEZNA ) 10 MG TABS Take 1 tablet by mouth daily. 90 tablet 1   amLODipine  (NORVASC ) 5 MG tablet Take 1 tablet (5 mg total) by mouth daily. (Patient not taking: Reported on 08/18/2023) 90 tablet 0   spironolactone  (ALDACTONE ) 25 MG tablet Take 1 tablet (25 mg total) by mouth daily. (Patient not taking: Reported on 08/18/2023) 90 tablet 0   No current facility-administered medications on file prior to visit.        ROS:  All others reviewed and negative.  Objective        PE:  BP 120/72   Pulse 61   Temp 98.3 F (36.8 C)   Ht 5' 8 (1.727 m)   Wt 175 lb (79.4 kg)   SpO2 98%   BMI 26.61 kg/m                 Constitutional: Pt appears mild ill               HENT: Head: NCAT.                Right Ear: External ear normal.                 Left Ear: External ear normal. Bilat tm's with mild erythema.  Max sinus areas non tender.  Pharynx with mild erythema, no exudate               Eyes: . Pupils are equal, round, and reactive to light. Conjunctivae and EOM are normal               Nose: without d/c or deformity               Neck: Neck supple. Gross normal ROM               Cardiovascular: Normal rate and regular rhythm.                 Pulmonary/Chest: Effort normal and breath sounds without rales or wheezing.                               Neurological: Pt is alert. At baseline orientation, motor grossly intact  Skin: Skin is warm. No rashes, no other new lesions, LE edema - none               Psychiatric: Pt behavior is normal without agitation   Micro: none  Cardiac tracings I have personally interpreted today:  none  Pertinent Radiological findings (summarize): none   Lab Results   Component Value Date   WBC 5.4 05/29/2023   HGB 12.3 05/29/2023   HCT 37.8 05/29/2023   PLT 335 05/29/2023   GLUCOSE 83 10/04/2021   CHOL 131 07/11/2020   TRIG 56 07/11/2020   HDL 58 07/11/2020   LDLCALC 64 07/11/2020   ALT 25 04/22/2023   AST 32 04/22/2023   NA 139 04/22/2023   K 3.6 04/22/2023   CL 105 04/22/2023   CREATININE 0.7 04/22/2023   BUN 9 04/22/2023   CO2 24 (A) 04/22/2023   TSH 2.20 04/23/2023   Assessment/Plan:  Sabrina Owen is a 48 y.o. Black or African American [2] female with  has a past medical history of Allergy, Anemia, Anxiety (1995), Cataract (2009), Depression (1995), GERD (gastroesophageal reflux disease), Lupus, MS (multiple sclerosis) (HCC), and Stroke (HCC) (2007).  Sinusitis Mild to mod, for antibx course levaquin  500 every day, hycodan prn, prednisone  taper, and depomedrol 80 mg IM,   to f/u any worsening symptoms or concerns, also for CT sinus r/o obstruction   MS (multiple sclerosis) (HCC) Onset symptoms after start tx per neurology with [facility], may need to reevaluate for further symptoms  Vitamin D  deficiency Last vitamin D  Lab Results  Component Value Date   VD25OH 23 04/22/2023   Low, to start oral replacement  Followup: Return if symptoms worsen or fail to improve.  Lynwood Rush, MD 08/18/2023 12:53 PM [facility] Internal Medicine

## 2023-08-18 NOTE — Assessment & Plan Note (Addendum)
 Last vitamin D  Lab Results  Component Value Date   VD25OH 23 04/22/2023   Low, to start oral replacement

## 2023-08-18 NOTE — Patient Instructions (Addendum)
 You had the steroid shot today  Please take all new medication as prescribed - the antibiotic, cough medicine, and prednisone   Please continue all other medications as before, and refills have been done if requested.  Please have the pharmacy call with any other refills you may need.  Please keep your appointments with your specialists as you may have planned  You will be contacted regarding the referral for: CT sinus  You may wish to see ENT again depending on the CT sinus results

## 2023-08-21 ENCOUNTER — Other Ambulatory Visit: Admitting: Pharmacist

## 2023-08-21 DIAGNOSIS — I10 Essential (primary) hypertension: Secondary | ICD-10-CM

## 2023-08-21 NOTE — Progress Notes (Signed)
 08/21/2023 Name: Sabrina Owen MRN: [id number] DOB: 05-Mar-1975  Chief Complaint  Patient presents with   Hypertension   Medication Management    Sabrina Owen is a 48 y.o. year old female who presented for a telephone visit.   They were referred to the pharmacist by their PCP for assistance in managing hypertension.   Subjective:  Care Team: Primary Care Provider: Joshua Debby CROME, MD ; Next Scheduled Visit: 10/26/23  Medication Access/Adherence  Current Pharmacy:  [id number] - [address], [place] - [address] [name] [address] Phone: [phone] Fax: [phone]  [facility] - [place], [place] - [address] [place] [address] Phone: [phone] Fax: [phone]  [facility] - [place], [place] - [address] [facility] [employer] [address] [place] [address] Phone: [phone] Fax: [phone]   Patient reports affordability concerns with their medications: No  Patient reports access/transportation concerns to their pharmacy: No  Patient reports adherence concerns with their medications:  No    Pt has MS and is on Ocrevus. Got last IV in May and since then she has had recurrent pink eye and respiratory infections.  Hypertension:  Current medications: none *Pt was prescribed amlodipine  and spironolactone  at last visit due to elevated BP. Pt notes she thinks her BP was elevated due to having a coughing fit. She notes she did try taking the BP meds one day and that it knocked her out - she was very weak and fatigued. She has not continued the medications since.  Patient has a validated, automated, upper arm home BP cuff Current blood pressure readings: 120-130/70-80 BP in office on 8/12 was 120/72 without medication  Patient denies hypotensive s/sx including dizziness, lightheadedness.  Patient denies hypertensive symptoms including  headache, chest pain, shortness of breath   Objective:  No results found for: HGBA1C  Lab Results  Component Value Date   CREATININE 0.7 04/22/2023   BUN 9 04/22/2023   NA 139 04/22/2023   K 3.6 04/22/2023   CL 105 04/22/2023   CO2 24 (A) 04/22/2023    Lab Results  Component Value Date   CHOL 131 07/11/2020   HDL 58 07/11/2020   LDLCALC 64 07/11/2020   TRIG 56 07/11/2020    Medications Reviewed Today     Reviewed by Merceda Lela SAUNDERS, RPH (Pharmacist) on 08/21/23 at 1048  Med List Status: <None>   Medication Order Taking? Sig Documenting Provider Last Dose Status Informant  albuterol  (VENTOLIN  HFA) 108 (90 Base) MCG/ACT inhaler [id number]  Inhale 2 puffs into the lungs every 6 (six) hours as needed for wheezing or shortness of breath. Alvia Corean CROME, FNP  Active    Patient not taking:   Discontinued 08/21/23 1048   amphetamine-dextroamphetamine (ADDERALL XR) 20 MG 24 hr capsule [id number]  Take 20 mg by mouth as needed. [provider]  Active   azelastine  (ASTELIN ) 0.1 % nasal spray [id number]  Place 2 sprays into both nostrils 2 (two) times daily. Use in each nostril as directed Tobie Eldora NOVAK, MD  Active   budesonide  (PULMICORT ) 0.5 MG/2ML nebulizer solution [id number]  Take 2 mLs (0.5 mg total) by nebulization 2 (two) times daily. Joshua Debby CROME, MD  Active   cholecalciferol (VITAMIN D3) 25 MCG (1000 UNIT) tablet [id number]  Take 5,000 Units by mouth daily. [provider]  Active   Eszopiclone  3 MG TABS [id number]  Take 1 tablet (3 mg  total) by mouth at bedtime. Take immediately before bedtime Joshua Debby CROME, MD  Active   fluconazole  (DIFLUCAN ) 150 MG tablet [id number]  Take 1 tablet (150 mg total) by mouth once for 1 dose. Alvia Corean CROME, FNP  Expired 08/18/23 2359   FLUoxetine (PROZAC) 20 MG capsule [id number]  Take 3 capsules by mouth daily. [provider]  Active   fluticasone  (FLONASE  SENSIMIST) 27.5 MCG/SPRAY nasal spray  [id number]  Place 2 sprays into the nose in the morning and at bedtime. Tobie Eldora NOVAK, MD  Active   HYDROcodone  bit-homatropine [facility]) 5-1.5 MG/5ML syrup [id number]  Take 5 mLs by mouth every 6 (six) hours as needed for up to 10 days. Norleen Lynwood ORN, MD  Active   levofloxacin  (LEVAQUIN ) 500 MG tablet [id number]  Take 1 tablet (500 mg total) by mouth daily. Norleen Lynwood ORN, MD  Active   lidocaine  (XYLOCAINE ) 2 % solution [id number]  Use as directed 15 mLs in the mouth or throat as needed for mouth pain. Moishe Chiquita HERO, NP  Active   Multiple Vitamin (MULTI-VITAMIN) tablet [id number]  Take 1 tablet by mouth daily. [provider]  Active   Multiple Vitamins-Minerals (ZINC  PO) [id number]  Take by mouth. [provider]  Active   neomycin -polymyxin b-dexamethasone (MAXITROL) 3.5-10000-0.1 SUSP [id number]  Place 2 drops into both eyes every 6 (six) hours. Joshua Debby CROME, MD  Active   norethindrone  (ORTHO MICRONOR ) 0.35 MG tablet [id number]  Take 1 tablet (0.35 mg total) by mouth daily. Glennon Almarie POUR, MD  Active   Omega-3 Fatty Acids (FISH OIL PO) [id number]  Take by mouth. [provider]  Active   predniSONE  (DELTASONE ) 10 MG tablet [id number]  3 tabs by mouth per day for 3 days,2tabs per day for 3 days,1tab per day for 3 days Norleen Lynwood ORN, MD  Active    Patient not taking:   Discontinued 08/21/23 1048   Thiamine HCl (VITAMIN B-1 PO) [id number]  Take by mouth. [provider]  Active   valACYclovir (VALTREX) 500 MG tablet [id number]  Take 1 tablet by mouth daily. [provider]  Active            Med Note SOUNDRA BOBETTA JINNY Pablo Sep 15, 2022  4:20 PM) Patient use PRN  Vonoprazan Fumarate  (VOQUEZNA ) 10 MG TABS [id number]  Take 1 tablet by mouth daily. Joshua Debby CROME, MD  Active               Assessment/Plan:   Hypertension: - Currently controlled, BP goal <130/80 - Reviewed long term cardiovascular and renal outcomes of uncontrolled blood  pressure - Reviewed appropriate blood pressure monitoring technique and reviewed goal blood pressure. Recommended to check home blood pressure and heart rate  - Recommend to continue without medication.  Continue monitoring BP and contact us  if seeing elevated BP readings consistently.  Misc: Advised pt to continue trying to reach neurology regarding what may be side effects related to Ocrevus.   Follow Up Plan: PRN  Darrelyn Drum, PharmD, BCPS, CPP Clinical Pharmacist Practitioner [facility] [phone]

## 2023-08-21 NOTE — Patient Instructions (Signed)
 It was a pleasure speaking with you today!  Continue monitoring blood pressures at home and let us  know if you are having elevated readings consistently.  Feel free to call with any questions or concerns!  Darrelyn Drum, PharmD, BCPS, CPP Clinical Pharmacist Practitioner [facility] [phone]

## 2023-08-24 ENCOUNTER — Inpatient Hospital Stay: Admitting: Genetic Counselor

## 2023-08-24 ENCOUNTER — Encounter: Payer: Self-pay | Admitting: Internal Medicine

## 2023-08-24 ENCOUNTER — Other Ambulatory Visit

## 2023-08-25 ENCOUNTER — Other Ambulatory Visit: Payer: Self-pay | Admitting: Internal Medicine

## 2023-08-25 DIAGNOSIS — J4540 Moderate persistent asthma, uncomplicated: Secondary | ICD-10-CM | POA: Insufficient documentation

## 2023-08-25 DIAGNOSIS — Z79899 Other long term (current) drug therapy: Secondary | ICD-10-CM

## 2023-08-28 ENCOUNTER — Inpatient Hospital Stay

## 2023-08-28 DIAGNOSIS — Z9884 Bariatric surgery status: Secondary | ICD-10-CM | POA: Diagnosis not present

## 2023-08-28 DIAGNOSIS — E60 Dietary zinc deficiency: Secondary | ICD-10-CM

## 2023-08-28 DIAGNOSIS — D508 Other iron deficiency anemias: Secondary | ICD-10-CM

## 2023-08-28 DIAGNOSIS — E559 Vitamin D deficiency, unspecified: Secondary | ICD-10-CM | POA: Diagnosis not present

## 2023-08-28 DIAGNOSIS — E538 Deficiency of other specified B group vitamins: Secondary | ICD-10-CM | POA: Diagnosis not present

## 2023-08-28 DIAGNOSIS — D539 Nutritional anemia, unspecified: Secondary | ICD-10-CM | POA: Diagnosis present

## 2023-08-28 LAB — CBC WITH DIFFERENTIAL (CANCER CENTER ONLY)
Abs Immature Granulocytes: 0.06 K/uL (ref 0.00–0.07)
Basophils Absolute: 0.1 K/uL (ref 0.0–0.1)
Basophils Relative: 1 %
Eosinophils Absolute: 0.1 K/uL (ref 0.0–0.5)
Eosinophils Relative: 1 %
HCT: 38.9 % (ref 36.0–46.0)
Hemoglobin: 13 g/dL (ref 12.0–15.0)
Immature Granulocytes: 1 %
Lymphocytes Relative: 26 %
Lymphs Abs: 2.3 K/uL (ref 0.7–4.0)
MCH: 30.4 pg (ref 26.0–34.0)
MCHC: 33.4 g/dL (ref 30.0–36.0)
MCV: 90.9 fL (ref 80.0–100.0)
Monocytes Absolute: 0.8 K/uL (ref 0.1–1.0)
Monocytes Relative: 9 %
Neutro Abs: 5.6 K/uL (ref 1.7–7.7)
Neutrophils Relative %: 62 %
Platelet Count: 335 K/uL (ref 150–400)
RBC: 4.28 MIL/uL (ref 3.87–5.11)
RDW: 14.9 % (ref 11.5–15.5)
WBC Count: 8.9 K/uL (ref 4.0–10.5)
nRBC: 0 % (ref 0.0–0.2)

## 2023-08-28 LAB — IRON AND IRON BINDING CAPACITY (CC-WL,HP ONLY)
Iron: 64 ug/dL (ref 28–170)
Saturation Ratios: 23 % (ref 10.4–31.8)
TIBC: 273 ug/dL (ref 250–450)
UIBC: 209 ug/dL (ref 148–442)

## 2023-08-28 LAB — FERRITIN: Ferritin: 111 ng/mL (ref 11–307)

## 2023-08-28 LAB — VITAMIN D 25 HYDROXY (VIT D DEFICIENCY, FRACTURES): Vit D, 25-Hydroxy: 41.77 ng/mL (ref 30–100)

## 2023-08-28 LAB — VITAMIN B12: Vitamin B-12: 925 pg/mL — ABNORMAL HIGH (ref 180–914)

## 2023-08-31 LAB — ZINC: Zinc: 64 ug/dL (ref 44–115)

## 2023-09-03 ENCOUNTER — Ambulatory Visit: Payer: Self-pay | Admitting: Genetic Counselor

## 2023-09-03 ENCOUNTER — Telehealth: Payer: Self-pay | Admitting: Genetic Counselor

## 2023-09-03 ENCOUNTER — Encounter: Payer: Self-pay | Admitting: Genetic Counselor

## 2023-09-03 DIAGNOSIS — Z1379 Encounter for other screening for genetic and chromosomal anomalies: Secondary | ICD-10-CM | POA: Insufficient documentation

## 2023-09-03 NOTE — Telephone Encounter (Signed)
 I contacted Ms. Crill to discuss her genetic testing results. No pathogenic variants were identified in the 77 genes analyzed. Detailed clinic note to follow.  The test report has been scanned into EPIC and is located under the Molecular Pathology section of the Results Review tab.  A portion of the result report is included below for reference.   Dail Lerew, MS, [facility] Genetic Counselor [email or web address] (P) [phone]

## 2023-09-03 NOTE — Progress Notes (Unsigned)
 HPI:   Ms. Hubbs was previously seen in the [facility] due to a family history of cancer and concerns regarding a hereditary predisposition to cancer. Please refer to our prior cancer genetics clinic note for more information regarding our discussion, assessment and recommendations, at the time. Ms. Kuri recent genetic test results were disclosed to her, as were recommendations warranted by these results. These results and recommendations are discussed in more detail below.  CANCER HISTORY:  Oncology History   No history exists.   FAMILY HISTORY:  We obtained a detailed, 4-generation family history.  Significant diagnoses are listed below:      Family History  Problem Relation Age of Onset   Lung cancer Maternal Grandmother          smoked   Multiple myeloma Maternal Aunt 60 - 69        multiple myeloma   Breast cancer Maternal Aunt 60 - 69        triple neg   Pancreatic cancer Maternal Aunt 60 - 69   Lung cancer Maternal Uncle 60 - 69        separate primary cancer   Prostate cancer Maternal Uncle 60 - 69   Prostate cancer Maternal Uncle 60 - 69   Multiple myeloma Maternal Cousin 60 - 69   Colon cancer Maternal Cousin 60 - 69   Colon cancer Maternal Cousin 60 - 69   Thyroid  cancer Maternal Cousin          dx. <50       Ms. Paternoster is unaware of previous family history of genetic testing for hereditary cancer risks. There is no reported Ashkenazi Jewish ancestry.   GENETIC TEST RESULTS:  The [facility] CancerNext-Expanded Panel+RNA found no pathogenic mutations.  The CancerNext-Expanded gene panel offered by [facility] and includes sequencing, rearrangement, and RNA analysis for the following 77 genes: AIP, ALK, APC, ATM, AXIN2, BAP1, BARD1, BMPR1A, BRCA1, BRCA2, BRIP1, CDC73, CDH1, CDK4, CDKN1B, CDKN2A, CEBPA, CHEK2, CTNNA1, DDX41, DICER1, ETV6, FH, FLCN, GATA2, LZTR1, MAX, MBD4, MEN1, MET, MLH1, MSH2, MSH3, MSH6, MUTYH, NF1, NF2, NTHL1, PALB2, PHOX2B,  PMS2, POT1, PRKAR1A, PTCH1, PTEN, RAD51C, RAD51D, RB1, RET, RPS20, RUNX1, SDHA, SDHAF2, SDHB, SDHC, SDHD, SMAD4, SMARCA4, SMARCB1, SMARCE1, STK11, SUFU, TMEM127, TP53, TSC1, TSC2, VHL, and WT1 (sequencing and deletion/duplication); EGFR, HOXB13, KIT, MITF, PDGFRA, POLD1, and POLE (sequencing only); EPCAM and GREM1 (deletion/duplication only).   The test report has been scanned into EPIC and is located under the Molecular Pathology section of the Results Review tab.  A portion of the result report is included below for reference. Genetic testing reported out on 09/02/2023.       Even though a pathogenic variant was not identified, possible explanations for the cancer in the family may include: There may be no hereditary risk for cancer in the family. The cancers in her family may be due to other genetic or environmental factors. There may be a gene mutation in one of these genes that current testing methods cannot detect, but that chance is small. There could be another gene that has not yet been discovered, or that we have not yet tested, that is responsible for the cancer diagnoses in the family.  It is also possible there is a hereditary cause for the cancer in the family that Ms. Italiano did not inherit.  Therefore, it is important to remain in touch with cancer genetics in the future so that we can continue to offer Ms. Kramar the  most up to date genetic testing.   ADDITIONAL GENETIC TESTING:  We discussed with Ms. Boulay that her genetic testing was fairly extensive.  If there are genes identified to increase cancer risk that can be analyzed in the future, we would be happy to discuss and coordinate this testing at that time.    CANCER SCREENING RECOMMENDATIONS:  Ms. Pagan test result is considered negative (normal).  This means that we have not identified a hereditary cause for her family history of cancer at this time.   An individual's cancer risk and medical management are not determined  by genetic test results alone. Overall cancer risk assessment incorporates additional factors, including personal medical history, family history, and any available genetic information that may result in a personalized plan for cancer prevention and surveillance. Therefore, it is recommended she continue to follow the cancer management and screening guidelines provided by her primary healthcare provider.  Based on the reported personal and family history, specific cancer screenings for Ms. Amery Vandenbos and her family include:  Breast Cancer Screening:  The Tyrer-Cuzick model is one of multiple prediction models developed to estimate an individual's lifetime risk of developing breast cancer. The Tyrer-Cuzick model is endorsed by the [employer] (NCCN). This model includes many risk factors such as family history, endogenous estrogen exposure, and benign breast disease. The calculation is highly-dependent on the accuracy of clinical data provided by the patient and can change over time. The Tyrer-Cuzick model may be repeated to reflect new information in her personal or family history in the future.   Ms. [place] Tyrer-Cuzick risk score is 7.6%. She is encouraged to continue to be mindful of her family history and be diligent with general population breast screening, including annual mammograms. She is encouraged to contact us  regarding any changes to her personal or family history, as her recommendations for screening would be altered significantly if her lifetime risk is determined to be greater than 20% based on updated information.       RECOMMENDATIONS FOR FAMILY MEMBERS:   Since she did not inherit a mutation in a cancer predisposition gene included on this panel, her children could not have inherited a mutation from her in one of these genes. Other members of the family may still carry a pathogenic variant in one of these genes that Ms. Casebolt did not inherit. Based on the  family history, we recommend her mother have genetic counseling and testing.   FOLLOW-UP:  Cancer genetics is a rapidly advancing field and it is possible that new genetic tests will be appropriate for her and/or her family members in the future. We encouraged her to remain in contact with cancer genetics on an annual basis so we can update her personal and family histories and let her know of advances in cancer genetics that may benefit this family.   Our contact number was provided. Ms. Dant questions were answered to her satisfaction, and she knows she is welcome to call us  at anytime with additional questions or concerns.   Dawanda Mapel, MS, [facility] Genetic Counselor [email or web address] (P) [phone]

## 2023-09-04 ENCOUNTER — Encounter: Payer: Self-pay | Admitting: Genetic Counselor

## 2023-09-04 ENCOUNTER — Encounter: Payer: Self-pay | Admitting: Hematology and Oncology

## 2023-09-04 ENCOUNTER — Inpatient Hospital Stay

## 2023-09-04 ENCOUNTER — Other Ambulatory Visit

## 2023-09-04 ENCOUNTER — Inpatient Hospital Stay: Admitting: Hematology and Oncology

## 2023-09-04 VITALS — BP 141/80 | HR 58 | Resp 18 | Ht 68.0 in | Wt 178.2 lb

## 2023-09-04 DIAGNOSIS — E538 Deficiency of other specified B group vitamins: Secondary | ICD-10-CM

## 2023-09-04 DIAGNOSIS — D539 Nutritional anemia, unspecified: Secondary | ICD-10-CM | POA: Diagnosis not present

## 2023-09-04 DIAGNOSIS — E559 Vitamin D deficiency, unspecified: Secondary | ICD-10-CM

## 2023-09-04 DIAGNOSIS — E60 Dietary zinc deficiency: Secondary | ICD-10-CM

## 2023-09-04 DIAGNOSIS — Z9884 Bariatric surgery status: Secondary | ICD-10-CM | POA: Diagnosis not present

## 2023-09-04 NOTE — Assessment & Plan Note (Addendum)
 She is known to have multiple mineral deficiencies including thiamine, zinc , B12 and iron  deficiencies secondary to prior bypass surgery I reviewed all test results with the patient She is not anemic and has adequate replacement Her B12 level is borderline high and she can space out her vitamin B12 injection to every 3 months I plan to see her again in 6 months for further follow-up

## 2023-09-04 NOTE — Progress Notes (Signed)
 [facility] OFFICE PROGRESS NOTE  Joshua Debby CROME, MD  ASSESSMENT & PLAN:  Assessment & Plan Deficiency anemia She is known to have multiple mineral deficiencies including thiamine, zinc , B12 and iron  deficiencies secondary to prior bypass surgery I reviewed all test results with the patient She is not anemic and has adequate replacement Her B12 level is borderline high and she can space out her vitamin B12 injection to every 3 months I plan to see her again in 6 months for further follow-up Gastric bypass status for obesity     Orders Placed This Encounter  Procedures   CBC with Differential (Cancer Center Only)    Standing Status:   Future    Expiration Date:   09/03/2024   Ferritin    Standing Status:   Future    Expiration Date:   09/03/2024   Iron  and Iron  Binding Capacity ([facility] only)    Standing Status:   Future    Expiration Date:   09/03/2024   Vitamin B12    Standing Status:   Future    Expiration Date:   09/03/2024   VITAMIN D  25 Hydroxy (Vit-D Deficiency, Fractures)    Standing Status:   Future    Expiration Date:   09/03/2024   Zinc     Standing Status:   Future    Expiration Date:   09/03/2024   Copper , serum    Standing Status:   Future    Expiration Date:   09/03/2024   Vitamin B1    Standing Status:   Future    Expiration Date:   09/03/2024    INTERVAL HISTORY: Patient returns for recurrent anemia Symptoms of anemia includes none We reviewed all test results with the patient  SUMMARY OF HEMATOLOGIC HISTORY:  She was found to have abnormal CBC from recent blood work The patient had sleeve gastrectomy in 2013 and gastric bypass surgery in 2021 Her original weight was over 300 pounds.  Prior to her last surgery, she weighed over 200 pounds She is currently maintaining around 175 pounds She is known to have multiple mineral deficiencies including vitamin D  deficiency, zinc  deficiency, iron  deficiency, vitamin B12 deficiency and thiamine  deficiency She has not been taking zinc  supplement consistently because it hurt her stomach She underwent EGD evaluation in March of which I have reviewed which show no evidence of ulceration She is not taking separate thiamine supplement.  She has received vitamin B12 injection last year She just completed 3 different courses of intravenous iron  infusion recently She complained of chronic fatigue She also have diagnosis of multiple sclerosis and has been receiving treatment at [facility] She was supposed to undergo hysterectomy for fibroid removal but surgery was canceled due to recent infusion for multiple sclerosis She has concern for cancer due to multiple family history of malignancies  She denies recent chest pain on exertion, shortness of breath on minimal exertion, pre-syncopal episodes, or palpitations. She had not noticed any recent bleeding such as epistaxis, hematuria or hematochezia The patient denies over the counter NSAID ingestion. She is not on antiplatelets agents.  She had no prior history or diagnosis of cancer. Her age appropriate screening programs are up-to-date. She denies any pica and eats a variety of diet. She never donated blood or received blood transfusion  Lab Results  Component Value Date   VITAMINB12 925 (H) 08/28/2023   FERRITIN 111 08/28/2023   Vitals:   09/04/23 1342  BP: (!) 141/80  Pulse: (!) 58  Resp: 18  SpO2: 100%

## 2023-09-14 ENCOUNTER — Ambulatory Visit

## 2023-10-26 ENCOUNTER — Ambulatory Visit: Admitting: Internal Medicine

## 2024-01-01 ENCOUNTER — Telehealth: Admitting: Family Medicine

## 2024-01-01 DIAGNOSIS — R509 Fever, unspecified: Secondary | ICD-10-CM

## 2024-01-01 NOTE — Progress Notes (Signed)
" °  Because of your continual symptoms and new symptoms, I feel your condition warrants further evaluation and I recommend that you be seen in an in person face-to-face visit.   NOTE: There will be NO CHARGE for this E-Visit   If you are having a true medical emergency, please call 911.     For an urgent face to face visit, [facility] has multiple urgent care centers for your convenience.  Click the link below for the full list of locations and hours, walk-in wait times, appointment scheduling options and driving directions:  Urgent Care - [place], [place], [place], [place], [place], [place]  [place]     Your MyChart E-visit questionnaire answers were reviewed by a board certified advanced clinical practitioner to complete your personal care plan based on your specific symptoms.    Thank you for using e-Visits.  I have spent 5 minutes in review of e-visit questionnaire, review and updating patient chart, medical decision making and response to patient.   Roosvelt Mater, PA-C       "

## 2024-02-26 ENCOUNTER — Inpatient Hospital Stay

## 2024-03-04 ENCOUNTER — Ambulatory Visit: Admitting: Hematology and Oncology

## 2024-05-09 IMAGING — CT CT RENAL STONE PROTOCOL
2 of 4 series · 16 of 46 positions shown, 18 images · non-contrast
Comparison: 09/12/2020

CLINICAL DATA: Left flank pain and nausea beginning today.
Nephrolithiasis.



[Series 2: axial st · axial · 0.98mm/px · z∈[-490,-85]mm · 13 of 89 slices shown, 15 images]
[im 4/89  soft-tissue]
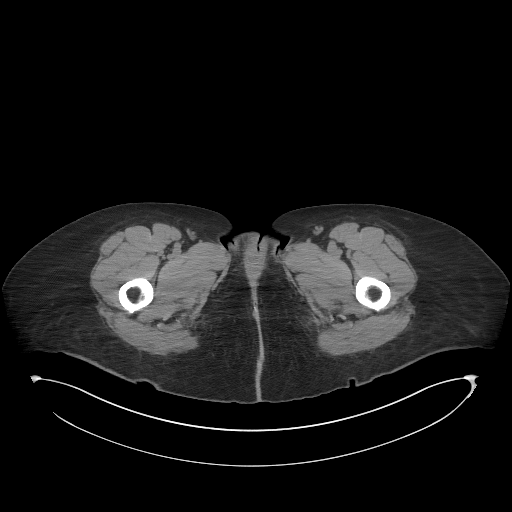
[im 4/89  bone]
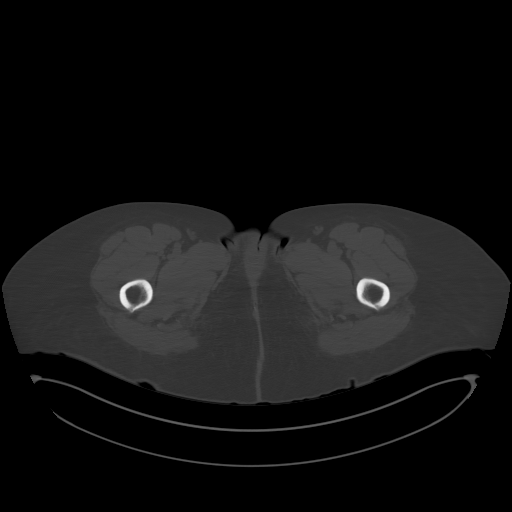
[im 12/89  soft-tissue]
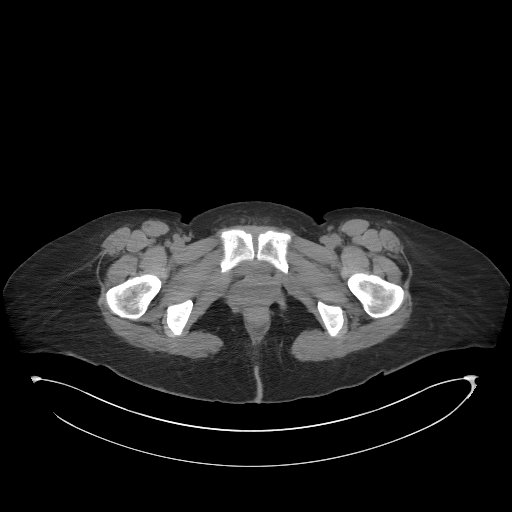
[im 19/89  soft-tissue]
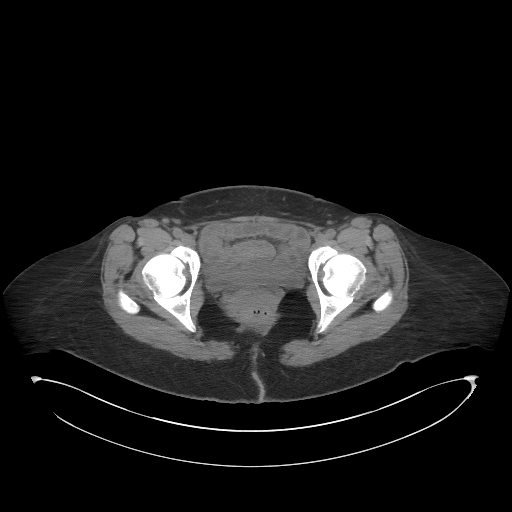
[im 26/89  soft-tissue]
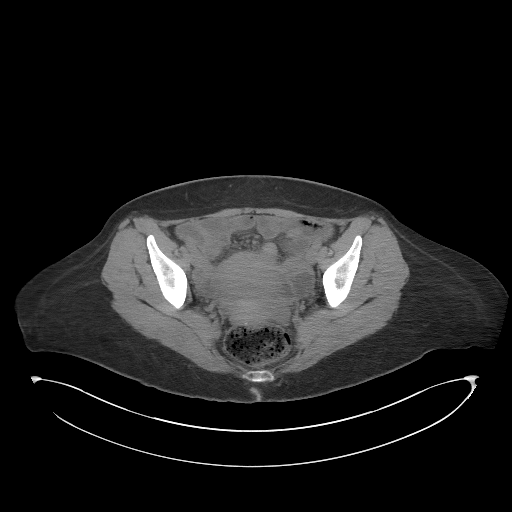
[im 30/89  soft-tissue]
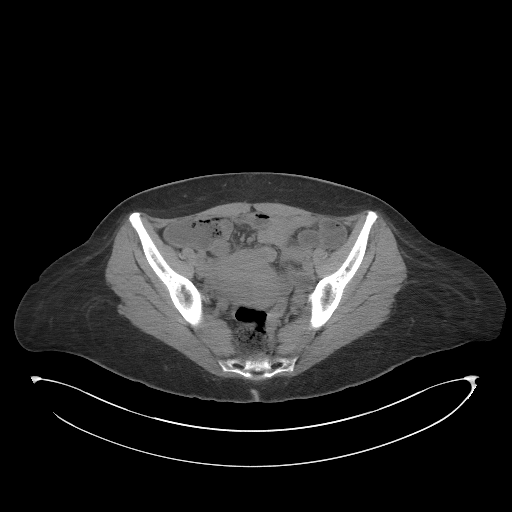
[im 37/89  soft-tissue]
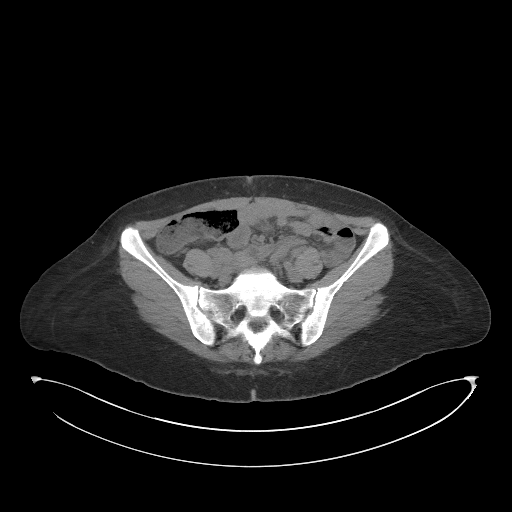
[im 45/89  soft-tissue]
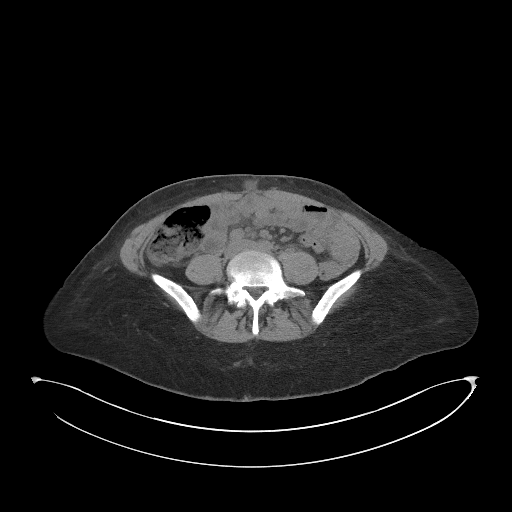
[im 52/89  soft-tissue]
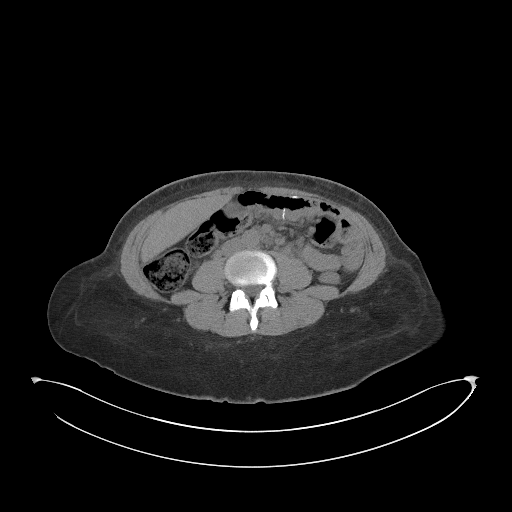
[im 59/89  soft-tissue]
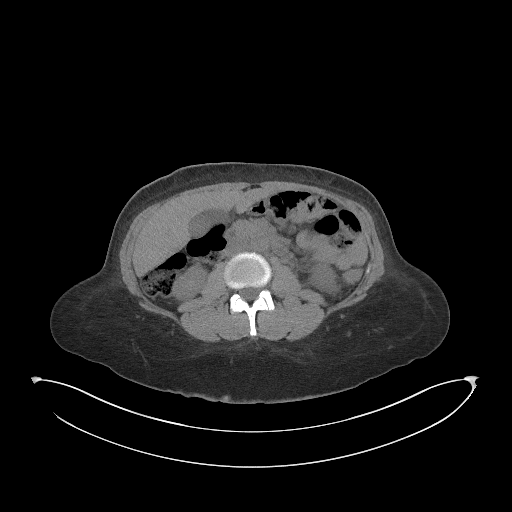
[im 59/89  bone]
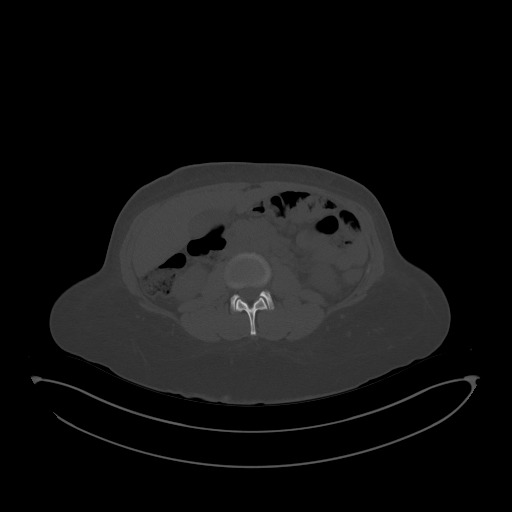
[im 63/89  soft-tissue]
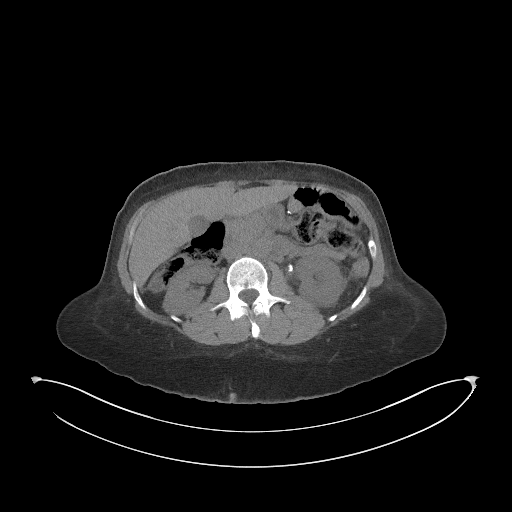
[im 70/89  soft-tissue]
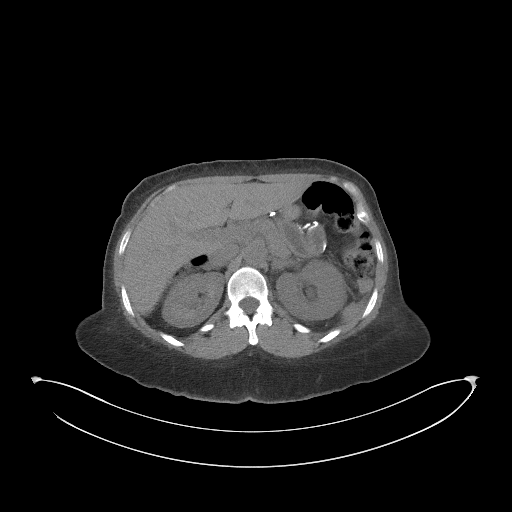
[im 78/89  soft-tissue]
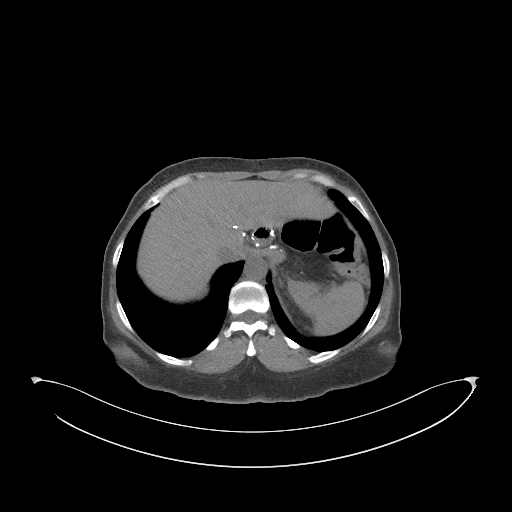
[im 85/89  soft-tissue]
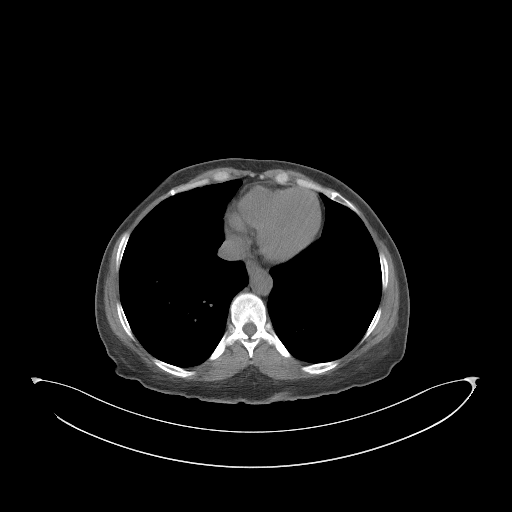

[Series 5: coronal st · coronal · 0.77mm/px · 3 of 83 slices shown]
[im 28/83  soft-tissue]
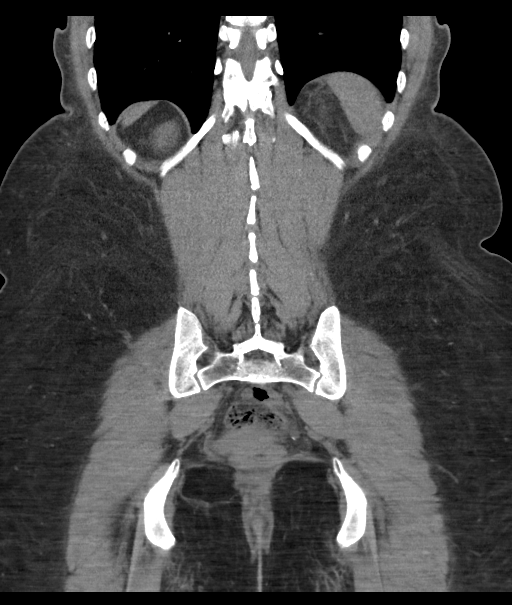
[im 37/83  soft-tissue]
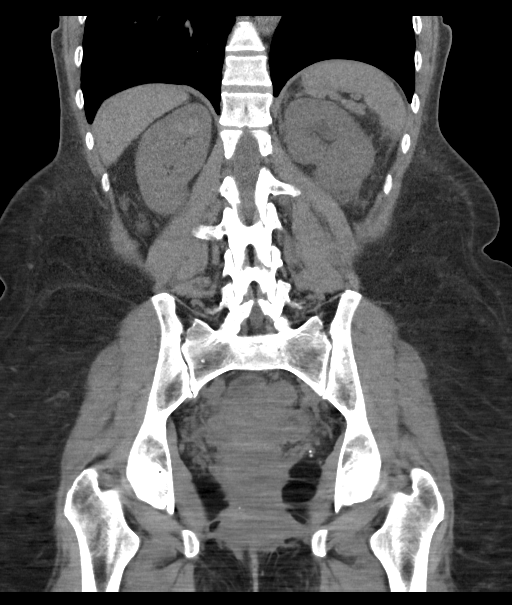
[im 46/83  soft-tissue]
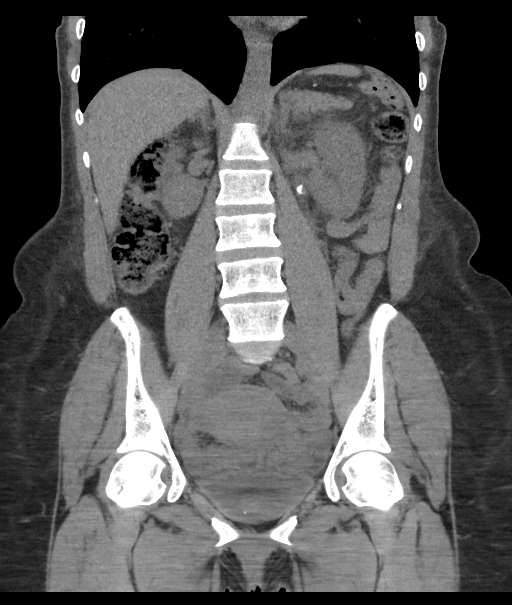

[16 of 46 positions shown; findings below may reference images not displayed]

FINDINGS: Lower chest: No acute findings.

Hepatobiliary: No mass visualized on this unenhanced exam.
Gallbladder is unremarkable. No evidence of biliary ductal
dilatation.

Pancreas: No mass or inflammatory process visualized on this
unenhanced exam.

Spleen:  Within normal limits in size.

Adrenals/Urinary tract: Right hydronephrosis is seen due to a 8 mm
calculus at the left UPJ. Mild left renal swelling and perinephric
stranding also demonstrated. No other urinary calculi identified.

Stomach/Bowel: Prior gastric bypass surgery again noted. No evidence
of obstruction, inflammatory process, or abnormal fluid collections.

Vascular/Lymphatic: No pathologically enlarged lymph nodes
identified. No evidence of abdominal aortic aneurysm.

Reproductive: At least 1 left-sided uterine fibroid again noted
measuring approximately 3 x 4 cm. Benign-appearing cystic lesion in
the right adnexa shows decrease since previous study, currently
measuring 2.9 x 2.6 cm compared to 3.8 x 2.9 cm on prior exam.

Other:  None.

Musculoskeletal:  No suspicious bone lesions identified.
IMPRESSION: Mild right hydronephrosis and perinephric stranding due to 8 mm
calculus at the left UPJ.

4 cm uterine fibroid.

Mild decrease in size of right adnexal cystic lesion, consistent
with benign etiology.
